# Patient Record
Sex: Male | Born: 1995 | Race: White | Hispanic: No | Marital: Single | Smoking: Former smoker
Health system: Southern US, Community
[De-identification: ages and names within clinical notes are randomized; demographics above are authoritative.]

## PROBLEM LIST (undated history)

## (undated) DIAGNOSIS — F909 Attention-deficit hyperactivity disorder, unspecified type: Secondary | ICD-10-CM

## (undated) DIAGNOSIS — S62109A Fracture of unspecified carpal bone, unspecified wrist, initial encounter for closed fracture: Secondary | ICD-10-CM

---

## 2017-03-03 ENCOUNTER — Emergency Department
Admission: EM | Admit: 2017-03-03 | Discharge: 2017-03-03 | Disposition: A | Discharge status: Home / Self Care | Payer: No Typology Code available for payment source | Attending: Emergency Medicine | Admitting: Emergency Medicine

## 2017-03-03 ENCOUNTER — Encounter: Payer: Self-pay | Admitting: Emergency Medicine

## 2017-03-03 ENCOUNTER — Other Ambulatory Visit: Payer: Self-pay

## 2017-03-03 DIAGNOSIS — M545 Low back pain: Secondary | ICD-10-CM | POA: Insufficient documentation

## 2017-03-03 DIAGNOSIS — M542 Cervicalgia: Secondary | ICD-10-CM | POA: Diagnosis not present

## 2017-03-03 DIAGNOSIS — Z041 Encounter for examination and observation following transport accident: Secondary | ICD-10-CM | POA: Insufficient documentation

## 2017-03-03 DIAGNOSIS — M7918 Myalgia, other site: Secondary | ICD-10-CM | POA: Diagnosis not present

## 2017-03-03 DIAGNOSIS — F17200 Nicotine dependence, unspecified, uncomplicated: Secondary | ICD-10-CM | POA: Diagnosis not present

## 2017-03-03 MED ORDER — IBUPROFEN 600 MG PO TABS: 600.0000 mg | ORAL_TABLET | Freq: Three times a day (TID) | ORAL | 0 refills | Status: DC | PRN

## 2017-03-03 MED ORDER — TRAMADOL HCL 50 MG PO TABS: 50.0000 mg | ORAL_TABLET | Freq: Four times a day (QID) | ORAL | 0 refills | Status: AC | PRN

## 2017-03-03 MED ORDER — CYCLOBENZAPRINE HCL 10 MG PO TABS: 10.0000 mg | ORAL_TABLET | Freq: Three times a day (TID) | ORAL | 0 refills | Status: DC | PRN
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
## 2018-07-30 ENCOUNTER — Ambulatory Visit
Admission: EM | Admit: 2018-07-30 | Discharge: 2018-07-30 | Disposition: A | Discharge status: Home / Self Care | Payer: 59 | Attending: Urgent Care | Admitting: Urgent Care

## 2018-07-30 ENCOUNTER — Other Ambulatory Visit: Payer: Self-pay

## 2018-07-30 DIAGNOSIS — L237 Allergic contact dermatitis due to plants, except food: Secondary | ICD-10-CM | POA: Diagnosis not present

## 2018-07-30 HISTORY — DX: Attention-deficit hyperactivity disorder, unspecified type: F90.9

## 2018-07-30 MED ORDER — PREDNISONE 10 MG (21) PO TBPK: ORAL_TABLET | Freq: Every day | ORAL | 0 refills | Status: DC

## 2018-07-30 MED ORDER — DIPHENHYDRAMINE HCL 2 % EX CREA: TOPICAL_CREAM | Freq: Three times a day (TID) | CUTANEOUS | 0 refills | Status: DC | PRN

## 2018-07-30 NOTE — Discharge Instructions (Addendum)
It was very nice seeing you today in clinic. Thank you for entrusting me with your care.   Please utilize the medications that we discussed. Your prescriptions have been called in to your pharmacy. Wash clothing and linens in [place] water to prevent spread. AVOID scratching.   Make arrangements to follow up with your regular doctor in 1 week for re-evaluation. If your symptoms/condition worsens, please seek follow up care either here or in the ER. Please remember, our [facility] providers are "right here with you" when you need [place].   Again, it was my pleasure to take care of you today. Thank you for choosing our clinic. I hope that you start to feel better quickly.   Honor Loh, MSN, APRN, FNP-C, CEN Advanced Practice Provider [facility]

## 2018-07-30 NOTE — ED Triage Notes (Signed)
Pt here for rash on both arms that has been present for almost 2 weeks. Has been trying otc stuff for poison ivy and oak without relief. It's red and itching. Gf also has the same thing

## 2018-07-30 NOTE — ED Provider Notes (Signed)
[address], [place], [place] [address] [phone]   Name: Braedan Meuth DOB: [birth year] MRN: [id number] CSN: [id number] PCP: Patient, No Pcp Per  Arrival date and time:  07/30/18 1247  Chief Complaint:  Rash  NOTE: Prior to seeing the patient today, I have reviewed the triage nursing documentation and vital signs. Clinical staff has updated patient's PMH/PSHx, current medication list, and drug allergies/intolerances to ensure comprehensive history available to assist in medical decision making.   History:   HPI: Jakeb Lamping is a 23 y.o. male who presents today with complaints of a pruritic rash to his BILATERAL upper extremities. Rash to initially declared 10 to 14 days ago. Rash started on his LEFT forearm and has progressively spread contralaterally to the RIGHT biceps area and axilla. Patient advising that he was working outside and inadvertently came into contact with poison oak while cutting down trees. Rash is erythematous and pruritic. He has not appreciated any drainage associated with the rash. There is no facial involvement; no rash to periorbital, paranasal, or perioral areas. Patient denies that he is experiencing any shortness of breath or sensation of pharyngeal/laryngeal fullness.   In efforts to help reduce/relieve the associated pruritis, the patient advising that he has used an unknown over-the-counter topical at home. Patient notes that conservative symptomatic management at home has proven to be ineffective overall.   Past Medical History:  Diagnosis Date  . ADHD     History reviewed. No pertinent surgical history.  Family History  Problem Relation Age of Onset  . Healthy Mother   . Diabetes Father     Social History   Socioeconomic History  . Marital status: Single    Spouse name: Not on file  . Number of children: Not on file  . Years of education: Not on file  . Highest education level: Not on file  Occupational History  . Not on  file  Social Needs  . Financial resource strain: Not on file  . Food insecurity    Worry: Not on file    Inability: Not on file  . Transportation needs    Medical: Not on file    Non-medical: Not on file  Tobacco Use  . Smoking status: Former Research scientist (life sciences)  . Smokeless tobacco: Never Used  Substance and Sexual Activity  . Alcohol use: No    Frequency: Never  . Drug use: Yes    Types: Marijuana  . Sexual activity: Not on file  Lifestyle  . Physical activity    Days per week: Not on file    Minutes per session: Not on file  . Stress: Not on file  Relationships  . Social Herbalist on phone: Not on file    Gets together: Not on file    Attends religious service: Not on file    Active member of club or organization: Not on file    Attends meetings of clubs or organizations: Not on file    Relationship status: Not on file  . Intimate partner violence    Fear of current or ex partner: Not on file    Emotionally abused: Not on file    Physically abused: Not on file    Forced sexual activity: Not on file  Other Topics Concern  . Not on file  Social History Narrative  . Not on file    There are no active problems to display for this patient.   Home Medications:    No outpatient medications have been  marked as taking for the 07/30/18 encounter [facility](Hospital Encounter).    Allergies:   Bee venom and Neosporin [neomycin-bacitracin zn-polymyx]  Review of Systems (ROS): Review of Systems  Constitutional: Negative for chills and fever.  HENT: Negative for drooling and trouble swallowing.   Respiratory: Negative for cough and shortness of breath.   Cardiovascular: Negative for chest pain and palpitations.  Skin: Positive for color change and rash.     Physical Exam:  Triage Vital Signs ED Triage Vitals  Enc Vitals Group     BP 07/30/18 1331 111/82     Pulse Rate 07/30/18 1331 (!) 59     Resp 07/30/18 1331 18     Temp 07/30/18 1331 97.9 F (36.6 C)     Temp Source  07/30/18 1331 Oral     SpO2 07/30/18 1331 100 %     Weight 07/30/18 1333 164 lb (74.4 kg)     Height --      Head Circumference --      Peak Flow --      Pain Score 07/30/18 1333 0     Pain Loc --      Pain Edu? --      Excl. in GC? --     Physical Exam  Constitutional: He is oriented to person, place, and time and well-developed, well-nourished, and in no distress.  HENT:  Head: Normocephalic and atraumatic.  Mouth/Throat: Oropharynx is clear and moist and mucous membranes are normal.  Eyes: Pupils are equal, round, and reactive to light. EOM are normal.  Neck: Normal range of motion. Neck supple. No tracheal deviation present.  Cardiovascular: Normal rate, regular rhythm, normal heart sounds and intact distal pulses. Exam reveals no gallop and no friction rub.  No murmur heard. Pulmonary/Chest: Effort normal and breath sounds normal. No respiratory distress. He has no wheezes. He has no rales.  Neurological: He is alert and oriented to person, place, and time.  Skin: Skin is warm and dry. Rash noted. Rash is vesicular (RIGHT biceps area and axilla; LEFT foream).  Rash pruritic.  No facial involvement.  No shortness of breath.  No oral pharyngeal/laryngeal fullness.  No dysphagia.   Psychiatric: Mood, affect and judgment normal.  Nursing note and vitals reviewed.    Urgent Care Treatments / Results:   LABS: PLEASE NOTE: all labs that were ordered this encounter are listed, however only abnormal results are displayed. Labs Reviewed - No data to display  EKG: -None  RADIOLOGY: No results found.  PROCEDURES: Procedures  MEDICATIONS RECEIVED THIS VISIT: Medications - No data to display  PERTINENT CLINICAL COURSE NOTES/UPDATES:   Initial Impression / Assessment and Plan / Urgent Care Course:    Janeal HolmesBrian Philippi is a 23 y.o. male who [facility] today with complaints of Rash  Pertinent labs & imaging results that were available during my care of the  patient were personally reviewed by me and considered in my medical decision making (see lab/imaging section of note for values and interpretations).  Patient overall well appearing and in no acute distress today in clinic. Exam reveals vesicular rash to patient's BILATERAL upper extremities.  Rash has worsened over the course of the last 10 to 14 days following known contact with poison oak.  Patient has used over-the-counter cream, however intervention has been ineffective in improving his symptoms.  Will treat patient with course of systemic steroids (prednisone).  He was encouraged to utilize oral diphenhydramine as needed for associated pruritus; educated on  the fact that it will make him sleepy.  Prescription sent for topical diphenhydramine for patient to utilize during the day to help with his itching.  He was encouraged to wash her close and linens in hot water to prevent further transmission.  Patient encouraged to avoid scratching to prevent spread.  Current clinical condition warrants patient being out of work in order to recover from his current injury/illness. He was provided with the appropriate documentation to provide to his place of employment that will allow for him to RTW on 08/01/2018 with no restrictions.   Discussed follow up with primary care physician in 1 week for re-evaluation. I have reviewed the follow up and strict return precautions for any new or worsening symptoms. Patient is aware of symptoms that would be deemed urgent/emergent, and would thus require further evaluation either here or in the emergency department. At the time of discharge, he verbalized understanding and consent with the discharge plan as it was reviewed with him. All questions were fielded by provider and/or clinic staff prior to patient discharge.    Final Clinical Impressions(s) / Urgent Care Diagnoses:   Final diagnoses:  Contact dermatitis due to poison oak    New Prescriptions:   Meds ordered  this encounter  Medications  . predniSONE (STERAPRED UNI-PAK 21 TAB) 10 MG (21) TBPK tablet    Sig: Take by mouth daily. Take by mouth: 60 mg x 1 day, 50 mg x 1 day, 40 mg x 1 day, 30 mg x 1 day, 20 mg x 1 day, 10 mg x 1 day    Dispense:  21 tablet    Refill:  0  . diphenhydrAMINE (BENADRYL) 2 % cream    Sig: Apply topically 3 (three) times daily as needed for itching.    Dispense:  30 g    Refill:  0    Controlled Substance Prescriptions:  [place] Controlled Substance Registry consulted? Not Applicable  NOTE: This note was prepared using Dragon dictation software along with smaller phrase technology. Despite my best ability to proofread, there is the potential that transcriptional errors may still occur from this process, and are completely unintentional.     Verlee MonteGray, Yves Fodor E, NP 07/30/18 1819

## 2019-08-10 ENCOUNTER — Ambulatory Visit
Admission: EM | Admit: 2019-08-10 | Discharge: 2019-08-10 | Disposition: A | Discharge status: Home / Self Care | Payer: HRSA Program | Attending: Family Medicine | Admitting: Family Medicine

## 2019-08-10 ENCOUNTER — Other Ambulatory Visit: Payer: Self-pay

## 2019-08-10 DIAGNOSIS — Z87891 Personal history of nicotine dependence: Secondary | ICD-10-CM | POA: Insufficient documentation

## 2019-08-10 DIAGNOSIS — R059 Cough, unspecified: Secondary | ICD-10-CM

## 2019-08-10 DIAGNOSIS — U071 COVID-19: Secondary | ICD-10-CM | POA: Insufficient documentation

## 2019-08-10 DIAGNOSIS — Z7952 Long term (current) use of systemic steroids: Secondary | ICD-10-CM | POA: Diagnosis not present

## 2019-08-10 DIAGNOSIS — Z79899 Other long term (current) drug therapy: Secondary | ICD-10-CM | POA: Diagnosis not present

## 2019-08-10 DIAGNOSIS — R05 Cough: Secondary | ICD-10-CM | POA: Insufficient documentation

## 2019-08-10 LAB — GROUP A STREP BY PCR: Group A Strep by PCR: NOT DETECTED

## 2019-08-10 MED ORDER — PSEUDOEPH-BROMPHEN-DM 30-2-10 MG/5ML PO SYRP: 10.0000 mL | ORAL_SOLUTION | Freq: Four times a day (QID) | ORAL | 0 refills | Status: DC | PRN

## 2019-08-10 NOTE — ED Provider Notes (Signed)
[facility]   [id number] 08/10/19 Arrival Time: 1541   CC: COVID symptoms  SUBJECTIVE: History from: patient.  Nathaniel Cooley is a 24 y.o. male who presents with abrupt onset of dry cough for the last 5 days.  Reports that he has taken Robitussin and Delsym with no relief.  Denies sick contacts.  Reports that he is working 2 jobs and thinks he has run himself down.  Requesting a note for work today.  Reports that he has not had Covid.  Reports he has not had Covid vaccines.  There are no aggravating or relieving factors.   Denies fever, chills, fatigue, sinus pain, rhinorrhea, sore throat, SOB, wheezing, chest pain, nausea, changes in bowel or bladder habits.    ROS: As per HPI.  All other pertinent ROS negative.     Past Medical History:  Diagnosis Date  . ADHD    History reviewed. No pertinent surgical history. Allergies  Allergen Reactions  . Bee Venom Swelling  . Neosporin [Neomycin-Bacitracin Zn-Polymyx] Rash   No current facility-administered medications on file prior to encounter.   Current Outpatient Medications on File Prior to Encounter  Medication Sig Dispense Refill  . cyclobenzaprine (FLEXERIL) 10 MG tablet Take 1 tablet (10 mg total) by mouth 3 (three) times daily as needed. 15 tablet 0  . diphenhydrAMINE (BENADRYL) 2 % cream Apply topically 3 (three) times daily as needed for itching. 30 g 0  . ibuprofen (ADVIL,MOTRIN) 600 MG tablet Take 1 tablet (600 mg total) by mouth every 8 (eight) hours as needed. 15 tablet 0  . predniSONE (STERAPRED UNI-PAK 21 TAB) 10 MG (21) TBPK tablet Take by mouth daily. Take by mouth: 60 mg x 1 day, 50 mg x 1 day, 40 mg x 1 day, 30 mg x 1 day, 20 mg x 1 day, 10 mg x 1 day 21 tablet 0   Social History   Socioeconomic History  . Marital status: Single    Spouse name: Not on file  . Number of children: Not on file  . Years of education: Not on file  . Highest education level: Not on file  Occupational History  . Not on file   Tobacco Use  . Smoking status: Former Games developer  . Smokeless tobacco: Never Used  Vaping Use  . Vaping Use: Former  Substance and Sexual Activity  . Alcohol use: No  . Drug use: Not Currently    Types: Marijuana  . Sexual activity: Not on file  Other Topics Concern  . Not on file  Social History Narrative  . Not on file   Social Determinants of Health   Financial Resource Strain:   . Difficulty of Paying Living Expenses:   Food Insecurity:   . Worried About Programme researcher, broadcasting/film/video in the Last Year:   . Barista in the Last Year:   Transportation Needs:   . Freight forwarder (Medical):   Marland Kitchen Lack of Transportation (Non-Medical):   Physical Activity:   . Days of Exercise per Week:   . Minutes of Exercise per Session:   Stress:   . Feeling of Stress :   Social Connections:   . Frequency of Communication with Friends and Family:   . Frequency of Social Gatherings with Friends and Family:   . Attends Religious Services:   . Active Member of Clubs or Organizations:   . Attends Banker Meetings:   Marland Kitchen Marital Status:   Intimate Partner Violence:   .  Fear of Current or Ex-Partner:   . Emotionally Abused:   Marland Kitchen Physically Abused:   . Sexually Abused:    Family History  Problem Relation Age of Onset  . Healthy Mother   . Diabetes Father     OBJECTIVE:  Vitals:   08/10/19 1555 08/10/19 1558  BP:  130/87  Pulse:  66  Resp:  16  Temp:  98.2 F (36.8 C)  TempSrc:  Oral  SpO2:  99%  Weight: 166 lb (75.3 kg)   Height: 5\' 11"  (1.803 m)      General appearance: alert; appears fatigued, but nontoxic; speaking in full sentences and tolerating own secretions HEENT: NCAT; Ears: EACs clear, TMs pearly gray; Eyes: PERRL.  EOM grossly intact. Sinuses: nontender; Nose: nares patent without rhinorrhea, Throat: oropharynx clear, tonsils non erythematous or enlarged, uvula midline  Neck: supple without LAD Lungs: unlabored respirations, symmetrical air entry;  cough: absent; no respiratory distress; CTAB Heart: regular rate and rhythm.  Radial pulses 2+ symmetrical bilaterally Skin: warm and dry Psychological: alert and cooperative; normal mood and affect  LABS:  No results found for this or any previous visit (from the past 24 hour(s)).   ASSESSMENT & PLAN:  1. Cough     Meds ordered this encounter  Medications  . brompheniramine-pseudoephedrine-DM 30-2-10 MG/5ML syrup    Sig: Take 10 mLs by mouth 4 (four) times daily as needed.    Dispense:  118 mL    Refill:  0    Order Specific Question:   Supervising Provider    Answer:   02-04-1975 Merrilee Jansky   Prescribe Bromfed for symptomatic treatment Rapid strep negative COVID testing ordered.  It will take between 1-2 days for test results.  Someone will contact you regarding abnormal results.    Patient should remain in quarantine until they have received Covid results.  If negative you may resume normal activities (go back to work/school) while practicing hand hygiene, social distance, and mask wearing.  If positive, patient should remain in quarantine for 10 days from symptom onset AND greater than 72 hours after symptoms resolution (absence of fever without the use of fever-reducing medication and improvement in respiratory symptoms), whichever is longer Get plenty of rest and push fluids Use OTC zyrtec for nasal congestion, runny nose, and/or sore throat Use OTC flonase for nasal congestion and runny nose Use medications daily for symptom relief Use OTC medications like ibuprofen or tylenol as needed fever or pain Call or go to the ED if you have any new or worsening symptoms such as fever, worsening cough, shortness of breath, chest tightness, chest pain, turning blue, changes in mental status.  Reviewed expectations re: course of current medical issues. Questions answered. Outlined signs and symptoms indicating need for more acute intervention. Patient verbalized  understanding. After Visit Summary given.         [[id number]], NP 08/10/19 1637

## 2019-08-10 NOTE — ED Triage Notes (Signed)
Pt states he's been coughing since Monday and now his throat started hurting.

## 2019-08-10 NOTE — Discharge Instructions (Addendum)
Your COVID test is pending.  You should self quarantine until the test result is back.    Rapid strep test is negative.  Will culture your specimen, and will let you know if it is positive needs further treatment.  Take Tylenol as needed for fever or discomfort.  Rest and keep yourself hydrated.    Go to the emergency department if you develop shortness of breath, severe diarrhea, high fever not relieved by Tylenol or ibuprofen, or other concerning symptoms.

## 2019-08-11 LAB — SARS CORONAVIRUS 2 (TAT 6-24 HRS): SARS Coronavirus 2: POSITIVE — AB

## 2019-08-13 ENCOUNTER — Telehealth: Payer: Self-pay | Admitting: Physician Assistant

## 2019-08-13 NOTE — Telephone Encounter (Signed)
Called to discuss with Nathaniel Cooley about Covid symptoms and the use of bamlanivimab/etesevimab or casirivimab/imdevimab, a monoclonal antibody infusion for those with mild to moderate Covid symptoms and at a high risk of hospitalization.     Pt is not qualified for this infusion due to lack of identified risk factors and co-morbid conditions.  Symptoms reviewed as well as criteria for ending isolation.  Symptoms reviewed that would warrant ED/Hospital evaluation as well should her condition worsen. Preventative practices reviewed. Patient verbalized understanding.  Pt does not qualify for infusion therapy as pt's symptoms first presented > 10 days prior to timing of infusion ( we open 7/8) Symptoms tier reviewed as well as criteria for ending isolation. Preventative practices reviewed. Patient verbalized understanding   [name] PA-C

## 2019-12-24 ENCOUNTER — Ambulatory Visit
Admission: EM | Admit: 2019-12-24 | Discharge: 2019-12-24 | Disposition: A | Discharge status: Home / Self Care | Payer: PRIVATE HEALTH INSURANCE | Attending: Emergency Medicine | Admitting: Emergency Medicine

## 2019-12-24 ENCOUNTER — Other Ambulatory Visit: Payer: Self-pay

## 2019-12-24 DIAGNOSIS — R059 Cough, unspecified: Secondary | ICD-10-CM | POA: Insufficient documentation

## 2019-12-24 DIAGNOSIS — Z20822 Contact with and (suspected) exposure to covid-19: Secondary | ICD-10-CM | POA: Insufficient documentation

## 2019-12-24 DIAGNOSIS — Z87891 Personal history of nicotine dependence: Secondary | ICD-10-CM | POA: Insufficient documentation

## 2019-12-24 MED ORDER — BENZONATATE 100 MG PO CAPS: 200.0000 mg | ORAL_CAPSULE | Freq: Three times a day (TID) | ORAL | 0 refills | Status: DC

## 2019-12-24 MED ORDER — PROMETHAZINE-DM 6.25-15 MG/5ML PO SYRP: 5.0000 mL | ORAL_SOLUTION | Freq: Four times a day (QID) | ORAL | 0 refills | Status: DC | PRN

## 2019-12-24 NOTE — ED Triage Notes (Signed)
Pt presents to Urgent Care with c/o dry cough x 1 week. Pt w/ no known COVID exposure; he has not been vaccinated.

## 2019-12-24 NOTE — ED Provider Notes (Signed)
[facility]    CSN: [id number] Arrival date & time: 12/24/19  1509      History   Chief Complaint Chief Complaint  Patient presents with   Cough    HPI Nathaniel Cooley is a 24 y.o. male.   24 year old male here for evaluation of dry cough.  Patient reports that he has had symptoms for the past week.  He denies shortness of breath or wheezing.  Denies fever, ear pain or pressure, nausea, vomiting, diarrhea, changes to taste or smell, sick contacts.  Patient has had some discomfort in his chest from coughing.  Patient has not received his Covid vaccine or his flu vaccine.     Past Medical History:  Diagnosis Date   ADHD     There are no problems to display for this patient.   History reviewed. No pertinent surgical history.     Home Medications    Prior to Admission medications   Medication Sig Start Date End Date Taking? Authorizing Provider  DM-Doxylamine-Acetaminophen (NYQUIL COLD & FLU PO) Take 5 mLs by mouth 4 (four) times daily as needed.   Yes [provider]  benzonatate (TESSALON) 100 MG capsule Take 2 capsules (200 mg total) by mouth every 8 (eight) hours. 12/24/19   Becky Augusta, NP  promethazine-dextromethorphan (PROMETHAZINE-DM) 6.25-15 MG/5ML syrup Take 5 mLs by mouth 4 (four) times daily as needed. 12/24/19   Becky Augusta, NP    Family History Family History  Problem Relation Age of Onset   Healthy Mother    Diabetes Father     Social History Social History   Tobacco Use   Smoking status: Former Smoker   Smokeless tobacco: Never Used  Building services engineer Use: Former  Substance Use Topics   Alcohol use: No   Drug use: Not Currently    Types: Marijuana     Allergies   Bee venom and Neosporin [neomycin-bacitracin zn-polymyx]   Review of Systems Review of Systems  Constitutional: Negative for activity change, appetite change, fatigue and fever.  HENT: Negative for congestion, ear discharge,  rhinorrhea, sinus pressure, sinus pain and sore throat.   Respiratory: Positive for cough. Negative for shortness of breath and wheezing.   Cardiovascular: Negative for chest pain.  Gastrointestinal: Negative for diarrhea, nausea and vomiting.  Musculoskeletal: Negative for arthralgias and myalgias.  Skin: Negative for rash.  Neurological: Negative for dizziness and numbness.  Hematological: Negative.   Psychiatric/Behavioral: Negative.      Physical Exam Triage Vital Signs ED Triage Vitals  Enc Vitals Group     BP 12/24/19 1638 136/75     Pulse Rate 12/24/19 1638 73     Resp 12/24/19 1638 16     Temp 12/24/19 1638 97.9 F (36.6 C)     Temp Source 12/24/19 1638 Oral     SpO2 12/24/19 1638 100 %     Weight 12/24/19 1633 161 lb (73 kg)     Height 12/24/19 1633 5\' 11"  (1.803 m)     Head Circumference --      Peak Flow --      Pain Score 12/24/19 1632 0     Pain Loc --      Pain Edu? --      Excl. in GC? --    No data found.  Updated Vital Signs BP 136/75 (BP Location: Right Arm)    Pulse 73    Temp 97.9 F (36.6 C) (Oral)    Resp 16  Ht 5\' 11"  (1.803 m)    Wt 161 lb (73 kg)    SpO2 100%    BMI 22.45 kg/m   Visual Acuity Right Eye Distance:   Left Eye Distance:   Bilateral Distance:    Right Eye Near:   Left Eye Near:    Bilateral Near:     Physical Exam Vitals and nursing note reviewed.  Constitutional:      General: He is not in acute distress.    Appearance: Normal appearance. He is normal weight. He is not toxic-appearing.  HENT:     Head: Normocephalic and atraumatic.     Right Ear: Tympanic membrane, ear canal and external ear normal.     Left Ear: Tympanic membrane, ear canal and external ear normal.     Nose: Congestion and rhinorrhea present.     Comments: Nasal mucosa is mildly edematous with clear postnasal drip.  No with erythema noted.  Patient has no tenderness to his sinuses with percussion.    Mouth/Throat:     Mouth: Mucous membranes are  moist.     Pharynx: Oropharynx is clear. No oropharyngeal exudate or posterior oropharyngeal erythema.     Comments: Patient has mild postnasal drip without erythema of posterior oropharynx. Eyes:     General: No scleral icterus.    Extraocular Movements: Extraocular movements intact.     Conjunctiva/sclera: Conjunctivae normal.     Pupils: Pupils are equal, round, and reactive to light.  Cardiovascular:     Rate and Rhythm: Normal rate and regular rhythm.     Pulses: Normal pulses.     Heart sounds: Normal heart sounds. No murmur heard.  No gallop.   Pulmonary:     Effort: Pulmonary effort is normal.     Breath sounds: Normal breath sounds. No wheezing, rhonchi or rales.  Musculoskeletal:        General: No swelling or tenderness. Normal range of motion.     Cervical back: Normal range of motion and neck supple.  Lymphadenopathy:     Cervical: No cervical adenopathy.  Skin:    General: Skin is warm and dry.     Capillary Refill: Capillary refill takes less than 2 seconds.     Findings: No erythema or rash.  Neurological:     General: No focal deficit present.     Mental Status: He is alert and oriented to person, place, and time.  Psychiatric:        Mood and Affect: Mood normal.        Behavior: Behavior normal.        Thought Content: Thought content normal.        Judgment: Judgment normal.      UC Treatments / Results  Labs (all labs ordered are listed, but only abnormal results are displayed) Labs Reviewed  SARS CORONAVIRUS 2 (TAT 6-24 HRS)    EKG   Radiology No results found.  Procedures Procedures (including critical care time)  Medications Ordered in UC Medications - No data to display  Initial Impression / Assessment and Plan / UC Course  I have reviewed the triage vital signs and the nursing notes.  Pertinent labs & imaging results that were available during my care of the patient were reviewed by me and considered in my medical decision making  (see chart for details).   Disease had a dry cough for a week.  Patient has no other symptoms.  Physical exam reveals mildly edematous nasal mucosa with clear nasal  discharge and clear postnasal drip.  No cervical lymphadenopathy appreciated no sinus tenderness to percussion.  Lung sounds are clear to auscultation all fields.  Patient states he does have allergies but typically only in the spring.  Will check Covid.  Will DC patient home with supportive therapy to await his Covid test results.  If the Covid test is positive will refer him to monoclonal antibody infusion.   Final Clinical Impressions(s) / UC Diagnoses   Final diagnoses:  Cough     Discharge Instructions     Isolate at home until the results of your Covid test are back.  If your Covid test is positive you will have to quarantine for 10 days from the start of your symptoms.  After 10 days you can break quarantine if your symptoms have improved and you have not had a fever for 24 hours.  Use the Tessalon Perles during the day for cough and the Promethazine DM at bedtime.  Do not take it with NyQuil.  Take over-the-counter Claritin, Zyrtec, or Allegra to help with postnasal drip that could be driving her cough.  Follow-up with your primary care provider if your symptoms worsen or continue.    ED Prescriptions    Medication Sig Dispense Auth. Provider   benzonatate (TESSALON) 100 MG capsule Take 2 capsules (200 mg total) by mouth every 8 (eight) hours. 21 capsule Becky Augusta, NP   promethazine-dextromethorphan (PROMETHAZINE-DM) 6.25-15 MG/5ML syrup Take 5 mLs by mouth 4 (four) times daily as needed. 118 mL Becky Augusta, NP     PDMP not reviewed this encounter.   Becky Augusta, NP 12/24/19 1708

## 2019-12-24 NOTE — Discharge Instructions (Addendum)
Isolate at home until the results of your Covid test are back.  If your Covid test is positive you will have to quarantine for 10 days from the start of your symptoms.  After 10 days you can break quarantine if your symptoms have improved and you have not had a fever for 24 hours.  Use the Tessalon Perles during the day for cough and the Promethazine DM at bedtime.  Do not take it with NyQuil.  Take over-the-counter Claritin, Zyrtec, or Allegra to help with postnasal drip that could be driving her cough.  Follow-up with your primary care provider if your symptoms worsen or continue.

## 2019-12-25 LAB — SARS CORONAVIRUS 2 (TAT 6-24 HRS): SARS Coronavirus 2: NEGATIVE

## 2019-12-30 ENCOUNTER — Ambulatory Visit (INDEPENDENT_AMBULATORY_CARE_PROVIDER_SITE_OTHER): Payer: PRIVATE HEALTH INSURANCE

## 2019-12-30 ENCOUNTER — Other Ambulatory Visit: Payer: Self-pay

## 2019-12-30 ENCOUNTER — Ambulatory Visit
Admission: EM | Admit: 2019-12-30 | Discharge: 2019-12-30 | Disposition: A | Discharge status: Home / Self Care | Payer: PRIVATE HEALTH INSURANCE | Attending: Emergency Medicine | Admitting: Emergency Medicine

## 2019-12-30 DIAGNOSIS — R6883 Chills (without fever): Secondary | ICD-10-CM

## 2019-12-30 DIAGNOSIS — R059 Cough, unspecified: Secondary | ICD-10-CM | POA: Diagnosis not present

## 2019-12-30 DIAGNOSIS — Z87891 Personal history of nicotine dependence: Secondary | ICD-10-CM | POA: Diagnosis not present

## 2019-12-30 MED ORDER — PREDNISONE 10 MG (21) PO TBPK: ORAL_TABLET | Freq: Every day | ORAL | 0 refills | Status: DC

## 2019-12-30 MED ORDER — DOXYCYCLINE HYCLATE 100 MG PO CAPS: 100.0000 mg | ORAL_CAPSULE | Freq: Two times a day (BID) | ORAL | 0 refills | Status: DC

## 2019-12-30 NOTE — ED Triage Notes (Signed)
Patient complains of cough and chills x 2 weeks. States that he has been taking the cough medicine. States that the tessalon perles has given him a migraine. Reports that he has drank all of the promethazine syrup without relief. Believes that cough has worsened.

## 2019-12-30 NOTE — Discharge Instructions (Addendum)
Take the prednisone as directed.  Take the doxycycline twice daily with food for 10 days.  We will arrange for an outpatient CT scan for better clarification of what this density in her right chest is.  We have referred you to our our administrative team to help you find a primary care provider.

## 2019-12-30 NOTE — ED Provider Notes (Signed)
[facility]    CSN: [id number] Arrival date & time: 12/30/19  1129      History   Chief Complaint Chief Complaint  Patient presents with   Cough    HPI Nathaniel Cooley is a 24 y.o. male.   HPI   24 year old male here for reevaluation of cough.  Patient was evaluated here at the urgent care 2 days ago for a cough that he had for 1 week.  He denied shortness of breath or wheezing at that time patient still denies shortness of breath or wheezing, no fever, no sputum production.  Patient was prescribed Promethazine DM and Tessalon Perles.  Patient reports that the [facility] were giving him a migraine headache.  Patient reports that he coughs so much now that he gets to the point of posttussive emesis.  Patient has completely finished 118 mL of Promethazine DM syrup.  Patient reports that he could not take it every 6 hours because he was at work so he would take it 20 mL at a time.  Past Medical History:  Diagnosis Date   ADHD     There are no problems to display for this patient.   History reviewed. No pertinent surgical history.     Home Medications    Prior to Admission medications   Medication Sig Start Date End Date Taking? Authorizing Provider  doxycycline (VIBRAMYCIN) 100 MG capsule Take 1 capsule (100 mg total) by mouth 2 (two) times daily. 12/30/19   Becky Augusta, NP  predniSONE (STERAPRED UNI-PAK 21 TAB) 10 MG (21) TBPK tablet Take by mouth daily. Take 6 tabs by mouth daily  for 2 days, then 5 tabs for 2 days, then 4 tabs for 2 days, then 3 tabs for 2 days, 2 tabs for 2 days, then 1 tab by mouth daily for 2 days 12/30/19   Becky Augusta, NP    Family History Family History  Problem Relation Age of Onset   Healthy Mother    Diabetes Father     Social History Social History   Tobacco Use   Smoking status: Former Smoker   Smokeless tobacco: Never Used  Building services engineer Use: Former  Substance Use Topics   Alcohol use: No    Drug use: Not Currently    Types: Marijuana     Allergies   Bee venom and Neosporin [neomycin-bacitracin zn-polymyx]   Review of Systems Review of Systems  Constitutional: Negative for activity change, appetite change and fever.  HENT: Negative for congestion, rhinorrhea and sore throat.   Respiratory: Positive for cough. Negative for shortness of breath.   Cardiovascular: Negative for chest pain.  Gastrointestinal: Negative for diarrhea, nausea and vomiting.  Musculoskeletal: Negative for arthralgias and myalgias.  Skin: Negative for rash.  Neurological: Negative for headaches.  Hematological: Negative.   Psychiatric/Behavioral: Negative.      Physical Exam Triage Vital Signs ED Triage Vitals [12/30/19 1203]  Enc Vitals Group     BP 112/82     Pulse Rate 66     Resp 18     Temp 97.8 F (36.6 C)     Temp Source Oral     SpO2 100 %     Weight 160 lb 15 oz (73 kg)     Height 5\' 11"  (1.803 m)     Head Circumference      Peak Flow      Pain Score 6     Pain Loc  Pain Edu?      Excl. in GC?    No data found.  Updated Vital Signs BP 112/82 (BP Location: Left Arm)    Pulse 66    Temp 97.8 F (36.6 C) (Oral)    Resp 18    Ht 5\' 11"  (1.803 m)    Wt 160 lb 15 oz (73 kg)    SpO2 100%    BMI 22.45 kg/m   Visual Acuity Right Eye Distance:   Left Eye Distance:   Bilateral Distance:    Right Eye Near:   Left Eye Near:    Bilateral Near:     Physical Exam Vitals and nursing note reviewed.  Constitutional:      General: He is not in acute distress.    Appearance: Normal appearance. He is normal weight. He is not toxic-appearing.  HENT:     Head: Normocephalic and atraumatic.  Eyes:     General: No scleral icterus.    Extraocular Movements: Extraocular movements intact.     Conjunctiva/sclera: Conjunctivae normal.     Pupils: Pupils are equal, round, and reactive to light.  Cardiovascular:     Rate and Rhythm: Normal rate and regular rhythm.     Pulses:  Normal pulses.     Heart sounds: Normal heart sounds. No murmur heard.  No gallop.   Pulmonary:     Effort: Pulmonary effort is normal.     Breath sounds: Normal breath sounds. No wheezing, rhonchi or rales.  Musculoskeletal:        General: No swelling or tenderness. Normal range of motion.  Skin:    General: Skin is warm and dry.     Capillary Refill: Capillary refill takes less than 2 seconds.     Findings: No bruising, erythema or lesion.  Neurological:     General: No focal deficit present.     Mental Status: He is alert and oriented to person, place, and time.  Psychiatric:        Mood and Affect: Mood normal.        Behavior: Behavior normal.        Thought Content: Thought content normal.        Judgment: Judgment normal.      UC Treatments / Results  Labs (all labs ordered are listed, but only abnormal results are displayed) Labs Reviewed - No data to display  EKG   Radiology DG Chest 2 View  Result Date: 12/30/2019 CLINICAL DATA:  Pt reports worsening cough and chills x 2 weeks with no OTC cough medicine relief . Previous smoker. cough EXAM: CHEST - 2 VIEW COMPARISON:  None. FINDINGS: Is RIGHT suprahilar density measuring 2.5 cm. No pleural fluid. No peripheral airspace disease. No pneumothorax. No acute osseous abnormality. IMPRESSION: RIGHT suprahilar density. Differential would include focal airspace disease, unusual vascular pattern, or less likely pulmonary parenchymal nodule. Recommend clinical correlation and consider CT of the thorax. At minimum recommend follow-up chest radiograph. Electronically Signed   By: 01/01/2020 M.D.   On: 12/30/2019 13:16    Procedures Procedures (including critical care time)  Medications Ordered in UC Medications - No data to display  Initial Impression / Assessment and Plan / UC Course  I have reviewed the triage vital signs and the nursing notes.  Pertinent labs & imaging results that were available during my care  of the patient were reviewed by me and considered in my medical decision making (see chart for details).   Patient has return  for reevaluation of his cough.  The treatment at home has not been helping him.  Patient reports that he does not get any relief from NyQuil, Promethazine DM, or the Tessalon Perles.  Patient reports the [facility] were causing the have a headache.  Patient also reports that he could not take the cough syrup while at work so he would just take the full dose dose at 1 sitting which was 20 mL.  He has completed the entire 118 mL bottle that he was prescribed.  Lung sounds are clear to auscultation but given the setting of continued cough will obtain chest x-ray.  Patient had a negative flu and Covid test 2 days ago.  Radiology is reading a 2.5 cm right suprahilar density on chest x-ray.  Recommending a CT scan for better clarification.  Patient declining CT scan of chest today. Will put in for a future order to have it performed this coming Wednesday. Will give patient prednisone to try and calm down inflammation and help with the cough. Patient also does not have a primary care provider so will make referral. Final Clinical Impressions(s) / UC Diagnoses   Final diagnoses:  Cough     Discharge Instructions     Take the prednisone as directed.  Take the doxycycline twice daily with food for 10 days.  We will arrange for an outpatient CT scan for better clarification of what this density in her right chest is.  We have referred you to our our administrative team to help you find a primary care provider.    ED Prescriptions    Medication Sig Dispense Auth. Provider   predniSONE (STERAPRED UNI-PAK 21 TAB) 10 MG (21) TBPK tablet Take by mouth daily. Take 6 tabs by mouth daily  for 2 days, then 5 tabs for 2 days, then 4 tabs for 2 days, then 3 tabs for 2 days, 2 tabs for 2 days, then 1 tab by mouth daily for 2 days 42 tablet Becky Augusta, NP   doxycycline  (VIBRAMYCIN) 100 MG capsule Take 1 capsule (100 mg total) by mouth 2 (two) times daily. 20 capsule Becky Augusta, NP     I have reviewed the PDMP during this encounter.   Becky Augusta, NP 12/30/19 1337

## 2020-01-02 ENCOUNTER — Other Ambulatory Visit: Payer: Self-pay

## 2020-01-02 ENCOUNTER — Ambulatory Visit
Admission: RE | Admit: 2020-01-02 | Discharge: 2020-01-02 | Disposition: A | Discharge status: Home / Self Care | Payer: PRIVATE HEALTH INSURANCE | Source: Ambulatory Visit | Attending: Emergency Medicine | Admitting: Emergency Medicine

## 2020-01-02 DIAGNOSIS — J188 Other pneumonia, unspecified organism: Secondary | ICD-10-CM | POA: Diagnosis not present

## 2020-01-02 DIAGNOSIS — J984 Other disorders of lung: Secondary | ICD-10-CM | POA: Insufficient documentation

## 2020-01-02 DIAGNOSIS — R911 Solitary pulmonary nodule: Secondary | ICD-10-CM | POA: Insufficient documentation

## 2020-01-02 DIAGNOSIS — R059 Cough, unspecified: Secondary | ICD-10-CM | POA: Insufficient documentation

## 2020-01-02 MED ORDER — IOHEXOL 300 MG/ML  SOLN
75.0000 mL | Freq: Once | INTRAMUSCULAR | Status: AC | PRN
  Administered 2020-01-02: 75 mL via INTRAVENOUS

## 2020-01-03 ENCOUNTER — Emergency Department: Payer: PRIVATE HEALTH INSURANCE

## 2020-01-03 ENCOUNTER — Inpatient Hospital Stay
Admission: EM | Admit: 2020-01-03 | Discharge: 2020-01-07 | DRG: 195 | Disposition: A | Discharge status: Home / Self Care | Payer: PRIVATE HEALTH INSURANCE | Attending: Internal Medicine | Admitting: Internal Medicine

## 2020-01-03 ENCOUNTER — Other Ambulatory Visit: Payer: Self-pay

## 2020-01-03 ENCOUNTER — Encounter: Payer: Self-pay | Admitting: Internal Medicine

## 2020-01-03 DIAGNOSIS — Z888 Allergy status to other drugs, medicaments and biological substances status: Secondary | ICD-10-CM

## 2020-01-03 DIAGNOSIS — J984 Other disorders of lung: Secondary | ICD-10-CM | POA: Diagnosis present

## 2020-01-03 DIAGNOSIS — Z20822 Contact with and (suspected) exposure to covid-19: Secondary | ICD-10-CM | POA: Diagnosis present

## 2020-01-03 DIAGNOSIS — R001 Bradycardia, unspecified: Secondary | ICD-10-CM | POA: Diagnosis not present

## 2020-01-03 DIAGNOSIS — R059 Cough, unspecified: Secondary | ICD-10-CM

## 2020-01-03 DIAGNOSIS — Z87891 Personal history of nicotine dependence: Secondary | ICD-10-CM

## 2020-01-03 DIAGNOSIS — Z8616 Personal history of COVID-19: Secondary | ICD-10-CM | POA: Diagnosis not present

## 2020-01-03 DIAGNOSIS — Z91030 Bee allergy status: Secondary | ICD-10-CM

## 2020-01-03 DIAGNOSIS — Z79899 Other long term (current) drug therapy: Secondary | ICD-10-CM | POA: Diagnosis not present

## 2020-01-03 DIAGNOSIS — Z7952 Long term (current) use of systemic steroids: Secondary | ICD-10-CM | POA: Diagnosis not present

## 2020-01-03 DIAGNOSIS — R9389 Abnormal findings on diagnostic imaging of other specified body structures: Secondary | ICD-10-CM

## 2020-01-03 DIAGNOSIS — J189 Pneumonia, unspecified organism: Secondary | ICD-10-CM | POA: Diagnosis not present

## 2020-01-03 DIAGNOSIS — J188 Other pneumonia, unspecified organism: Principal | ICD-10-CM | POA: Diagnosis present

## 2020-01-03 LAB — TROPONIN I (HIGH SENSITIVITY)
Troponin I (High Sensitivity): 3 ng/L (ref <18)
Troponin I (High Sensitivity): 3 ng/L (ref <18)

## 2020-01-03 LAB — CBC
HCT: 41.9 % (ref 39.0–52.0)
Hemoglobin: 13.8 g/dL (ref 13.0–17.0)
MCH: 28.9 pg (ref 26.0–34.0)
MCHC: 32.9 g/dL (ref 30.0–36.0)
MCV: 87.8 fL (ref 80.0–100.0)
Platelets: 236 10*3/uL (ref 150–400)
RBC: 4.77 MIL/uL (ref 4.22–5.81)
RDW: 12 % (ref 11.5–15.5)
WBC: 5.1 10*3/uL (ref 4.0–10.5)
nRBC: 0 % (ref 0.0–0.2)

## 2020-01-03 LAB — COMPREHENSIVE METABOLIC PANEL
ALT: 15 U/L (ref 0–44)
AST: 17 U/L (ref 15–41)
Albumin: 3.9 g/dL (ref 3.5–5.0)
Alkaline Phosphatase: 65 U/L (ref 38–126)
Anion gap: 10 (ref 5–15)
BUN: 19 mg/dL (ref 6–20)
CO2: 23 mmol/L (ref 22–32)
Calcium: 8.9 mg/dL (ref 8.9–10.3)
Chloride: 107 mmol/L (ref 98–111)
Creatinine, Ser: 0.9 mg/dL (ref 0.61–1.24)
GFR, Estimated: >60 mL/min (ref >60)
Glucose, Bld: 84 mg/dL (ref 70–99)
Potassium: 3.6 mmol/L (ref 3.5–5.1)
Sodium: 140 mmol/L (ref 135–145)
Total Bilirubin: 0.7 mg/dL (ref 0.3–1.2)
Total Protein: 6.7 g/dL (ref 6.5–8.1)

## 2020-01-03 LAB — RESP PANEL BY RT-PCR (FLU A&B, COVID) ARPGX2
Influenza A by PCR: NEGATIVE
Influenza B by PCR: NEGATIVE
SARS Coronavirus 2 by RT PCR: NEGATIVE

## 2020-01-03 LAB — HIV ANTIBODY (ROUTINE TESTING W REFLEX): HIV Screen 4th Generation wRfx: NONREACTIVE

## 2020-01-03 MED ORDER — SUCCINYLCHOLINE CHLORIDE 20 MG/ML IJ SOLN: INTRAMUSCULAR | Status: AC | PRN

## 2020-01-03 MED ORDER — ACETAMINOPHEN 325 MG PO TABS: 650.0000 mg | ORAL_TABLET | Freq: Four times a day (QID) | ORAL | Status: DC | PRN

## 2020-01-03 MED ORDER — SODIUM CHLORIDE 0.9 % IV SOLN
2.0000 g | INTRAVENOUS | Status: DC
  Administered 2020-01-03 – 2020-01-06 (×4): 2 g via INTRAVENOUS
  Filled 2020-01-03 (×2): qty 2
  Filled 2020-01-03 (×3): qty 20

## 2020-01-03 MED ORDER — SODIUM CHLORIDE 0.9 % IV SOLN
500.0000 mg | INTRAVENOUS | Status: DC
  Administered 2020-01-03 – 2020-01-06 (×4): 500 mg via INTRAVENOUS
  Filled 2020-01-03 (×5): qty 500

## 2020-01-03 MED ORDER — TRAMADOL HCL 50 MG PO TABS
50.0000 mg | ORAL_TABLET | Freq: Two times a day (BID) | ORAL | Status: DC | PRN
  Administered 2020-01-04: 50 mg via ORAL
  Filled 2020-01-03: qty 1

## 2020-01-03 MED ORDER — FENTANYL CITRATE (PF) 100 MCG/2ML IJ SOLN
25.0000 ug | Freq: Once | INTRAMUSCULAR | Status: AC
  Administered 2020-01-03: 25 ug via INTRAVENOUS
  Filled 2020-01-03: qty 2

## 2020-01-03 MED ORDER — ETOMIDATE 2 MG/ML IV SOLN: INTRAVENOUS | Status: AC | PRN

## 2020-01-03 MED ORDER — KETOROLAC TROMETHAMINE 30 MG/ML IJ SOLN
15.0000 mg | Freq: Once | INTRAMUSCULAR | Status: AC
  Administered 2020-01-03: 15 mg via INTRAVENOUS
  Filled 2020-01-03: qty 1

## 2020-01-03 MED ORDER — SODIUM CHLORIDE 0.9 % IV SOLN: INTRAVENOUS | Status: DC

## 2020-01-03 MED ORDER — LACTATED RINGERS IV BOLUS
1000.0000 mL | Freq: Once | INTRAVENOUS | Status: AC
  Administered 2020-01-03: 1000 mL via INTRAVENOUS

## 2020-01-03 NOTE — ED Notes (Signed)
Zithromax restarted.

## 2020-01-03 NOTE — ED Notes (Signed)
Bed is cleaned on the floor. Transport request put in.

## 2020-01-03 NOTE — ED Notes (Signed)
Pt given meal tray.

## 2020-01-03 NOTE — ED Notes (Signed)
Patient is eating dinner tray. NAD.
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
## 2020-01-03 NOTE — ED Triage Notes (Signed)
Pt states that he has had a cough and not feeling well for the past couple weeks, pt states that he was seen at urgent care and negative for covid, had an x-ray and done and told he had a pneumonia and was started on prednisone, and something for his cough. Pt states this am he had a sudden onset of left side pain that caused him to fall down.

## 2020-01-03 NOTE — ED Notes (Signed)
Patient given pillow and warm blanket. Mother at bedside.

## 2020-01-03 NOTE — ED Notes (Signed)
Rapid response and documentation of succinylcholine documented in error.  Wrong patient.  Medications not given.

## 2020-01-03 NOTE — ED Triage Notes (Addendum)
Pt presents via EMS with COVID. Dx with COVID in July per EMS and treated at [facility] with Prednisone a couple of days ago with CT chest completed with pneumonia. Reporting LLQ pain.

## 2020-01-03 NOTE — ED Notes (Signed)
Patient c/o burning at left hand IV site. Medication paused until Rocephin is finished in left forearm site and then will resume Zithromax infusion in left forearm site. Good blood return noted from left hand IV site.

## 2020-01-03 NOTE — H&P (Signed)
History and Physical    Annette Liotta XIP:[id number] DOB: 22-Jan-1996 DOA: 01/03/2020  PCP: Patient, No Pcp Per   Patient coming from: Home  I have personally briefly reviewed patient's old medical records in [facility]  Chief Complaint: Cough                                Left lateral chest wall pain  HPI: Nathaniel Cooley is a 24 y.o. male with no significant past medical history who presents to the ER for evaluation of severe left lateral chest wall pain and a refractory nonproductive cough. Patient states that he had Covid in July and had recovered completely from that but about 2 weeks ago he developed a cough which is nonproductive, severe and has not responded to antitussives. He has had multiple urgent care visits in the past 10 days for evaluation of this cough and was recently placed on doxycycline and prednisone. He had a chest x-ray which showed a right upper lobe density and a follow-up CT scan which showed a cavitary lesion in his right upper lobe. He presented to the emergency room today with his mother due to severe left lateral chest wall pain. He denies having any fever, no chills, no shortness of breath, no weight loss, he denies having any night sweats or any contact with someone who has a chronic cough. He denies having any chest pain, no nausea, no vomiting, no abdominal pain or any changes in his bowel habits. Labs show sodium 140, potassium 3.6, chloride 107, bicarb 23, glucose 84, BUN 19, creatinine 0.90, calcium 8.9, albumin 3.9, AST 17, ALT 15, total protein 6.7, total bilirubin 0.7, white count 5.1, hemoglobin 13.8, hematocrit 41.9, MCV 87.8, RDW 12.0, platelet count 236 Respiratory viral panel is pending Chest x-ray shows vague right suprahilar opacity and subtle groundglass opacities within the periphery of the right upper lobe CT scan of the chest shows 16 x 13 mm cavitating abnormality is noted posteriorly in the right upper lobe most consistent with  cavitating pneumonia. At least 2 other densities are noted in the right upper lobe, with 1 measuring 12 x 7 mm just above the right minor fissure. These findings most likely represent multifocal inflammation or pneumonia. Follow-up CT scan in 3-4 weeks is recommended to ensure resolution and rule out underlying malignancy. Twelve-lead EKG reviewed by me shows normal sinus rhythm    ED Course: Patient is a 24 year old Caucasian male who presents to the ER accompanied by his mother for evaluation of left lateral chest wall pain as well as a refractory nonproductive cough. Patient has had multiple visits to the an urgent care center for same and was treated with doxycycline and prednisone. He had a CT scan of the chest which shows cavitating pneumonia in the right upper lobe. Discussed with pulmonologist, patient will be placed on isolation until TB is ruled out. For possible bronchoscopy in a.m. patient will be admitted to the hospital for further evaluation.  Review of Systems: As per HPI otherwise 10 point review of systems negative.    Past Medical History:  Diagnosis Date  . ADHD     No past surgical history on file.   reports that he has quit smoking. He has never used smokeless tobacco. He reports previous drug use. Drug: Marijuana. He reports that he does not drink alcohol.  Allergies  Allergen Reactions  . Bee Venom Swelling  . Neosporin [Neomycin-Bacitracin Zn-Polymyx]  Rash    Family History  Problem Relation Age of Onset  . Healthy Mother   . Diabetes Father      Prior to Admission medications   Medication Sig Start Date End Date Taking? Authorizing Provider  doxycycline (VIBRAMYCIN) 100 MG capsule Take 1 capsule (100 mg total) by mouth 2 (two) times daily. 12/30/19   Becky Augusta, NP  predniSONE (STERAPRED UNI-PAK 21 TAB) 10 MG (21) TBPK tablet Take by mouth daily. Take 6 tabs by mouth daily  for 2 days, then 5 tabs for 2 days, then 4 tabs for 2 days, then 3 tabs for 2  days, 2 tabs for 2 days, then 1 tab by mouth daily for 2 days 12/30/19   Becky Augusta, NP    Physical Exam: Vitals:   01/03/20 1234  BP: 129/80  Pulse: 75  Resp: 18  Temp: 98.5 F (36.9 C)  TempSrc: Oral  SpO2: 100%  Weight: 77.1 kg  Height: 5\' 11"  (1.803 m)     Vitals:   01/03/20 1234  BP: 129/80  Pulse: 75  Resp: 18  Temp: 98.5 F (36.9 C)  TempSrc: Oral  SpO2: 100%  Weight: 77.1 kg  Height: 5\' 11"  (1.803 m)    Constitutional: NAD, alert and oriented x 3, thin Eyes: PERRL, lids and conjunctivae normal ENMT: Mucous membranes are moist.  Neck: normal, supple, no masses, no thyromegaly Respiratory: Bilateral air entry, no wheezing, no crackles. Normal respiratory effort. No accessory muscle use.  Cardiovascular: Regular rate and rhythm, no murmurs / rubs / gallops. No extremity edema. 2+ pedal pulses. No carotid bruits.  Abdomen: no tenderness, no masses palpated. No hepatosplenomegaly. Bowel sounds positive.  Musculoskeletal: no clubbing / cyanosis. No joint deformity upper and lower extremities.  Skin: no rashes, lesions, ulcers.  Neurologic: No gross focal neurologic deficit. Psychiatric: Normal mood and affect.   Labs on Admission: I have personally reviewed following labs and imaging studies  CBC: Recent Labs  Lab 01/03/20 1238  WBC 5.1  HGB 13.8  HCT 41.9  MCV 87.8  PLT 236   Basic Metabolic Panel: Recent Labs  Lab 01/03/20 1238  NA 140  K 3.6  CL 107  CO2 23  GLUCOSE 84  BUN 19  CREATININE 0.90  CALCIUM 8.9   GFR: Estimated Creatinine Clearance: 132.5 mL/min (by C-G formula based on SCr of 0.9 mg/dL). Liver Function Tests: Recent Labs  Lab 01/03/20 1238  AST 17  ALT 15  ALKPHOS 65  BILITOT 0.7  PROT 6.7  ALBUMIN 3.9   No results for input(s): LIPASE, AMYLASE in the last 168 hours. No results for input(s): AMMONIA in the last 168 hours. Coagulation Profile: No results for input(s): INR, PROTIME in the last 168 hours. Cardiac  Enzymes: No results for input(s): CKTOTAL, CKMB, CKMBINDEX, TROPONINI in the last 168 hours. BNP (last 3 results) No results for input(s): PROBNP in the last 8760 hours. HbA1C: No results for input(s): HGBA1C in the last 72 hours. CBG: No results for input(s): GLUCAP in the last 168 hours. Lipid Profile: No results for input(s): CHOL, HDL, LDLCALC, TRIG, CHOLHDL, LDLDIRECT in the last 72 hours. Thyroid Function Tests: No results for input(s): TSH, T4TOTAL, FREET4, T3FREE, THYROIDAB in the last 72 hours. Anemia Panel: No results for input(s): VITAMINB12, FOLATE, FERRITIN, TIBC, IRON, RETICCTPCT in the last 72 hours. Urine analysis: No results found for: COLORURINE, APPEARANCEUR, LABSPEC, PHURINE, GLUCOSEU, HGBUR, BILIRUBINUR, KETONESUR, PROTEINUR, UROBILINOGEN, NITRITE, LEUKOCYTESUR  Radiological Exams on Admission: DG Chest 2  View  Result Date: 01/03/2020 CLINICAL DATA:  Cough, pneumonia EXAM: CHEST - 2 VIEW COMPARISON:  01/02/2020 FINDINGS: The heart size and mediastinal contours are within normal limits. Vague right suprahilar opacity and subtle ground-glass opacities within the periphery of the right upper lobe, better seen on previous CT. No new airspace consolidations. No pleural effusion or pneumothorax. The visualized skeletal structures are unremarkable. IMPRESSION: Vague right suprahilar opacity and subtle ground-glass opacities within the periphery of the right upper lobe, better seen on previous CT. No appreciable interval change. Electronically Signed   By: Duanne Guess D.O.   On: 01/03/2020 13:13   CT CHEST W CONTRAST  Result Date: 01/02/2020 CLINICAL DATA:  Cough, lung nodule. EXAM: CT CHEST WITH CONTRAST TECHNIQUE: Multidetector CT imaging of the chest was performed during intravenous contrast administration. CONTRAST:  62mL OMNIPAQUE IOHEXOL 300 MG/ML  SOLN COMPARISON:  December 30, 2019. FINDINGS: Cardiovascular: No significant vascular findings. Normal heart size. No  pericardial effusion. Mediastinum/Nodes: No enlarged mediastinal, hilar, or axillary lymph nodes. Thyroid gland, trachea, and esophagus demonstrate no significant findings. Lungs/Pleura: No pneumothorax or pleural effusion is noted. Left lung is clear. 16 x 13 mm cavitating abnormality is noted posteriorly in the right upper lobe most consistent with cavitating pneumonia. At least 2 other densities are noted in the right upper lobe, with 1 measuring 12 x 7 mm just above the right minor fissure. These findings most likely represent multifocal inflammation or pneumonia. Upper Abdomen: No acute abnormality. Musculoskeletal: No chest wall abnormality. No acute or significant osseous findings. IMPRESSION: 16 x 13 mm cavitating abnormality is noted posteriorly in the right upper lobe most consistent with cavitating pneumonia. At least 2 other densities are noted in the right upper lobe, with 1 measuring 12 x 7 mm just above the right minor fissure. These findings most likely represent multifocal inflammation or pneumonia. Follow-up CT scan in 3-4 weeks is recommended to ensure resolution and rule out underlying malignancy. Electronically Signed   By: Lupita Raider M.D.   On: 01/02/2020 12:44    EKG: Independently reviewed.  Sinus rhythm  Assessment/Plan Active Problems:   Cavitary pneumonia     Cavitary pneumonia Involving the right upper lobe ?? Will need to rule out pulmonary tuberculosis Will place patient on airborne precautions Will request pulmonology consult Place patient empirically on antibiotic therapy with Rocephin and Zithromax    DVT prophylaxis: SCD (Possible procedure in am) Code Status: Full code Family Communication: Greater than 30% of time was spent discussing plan of care with patient and his mother at the bedside. All questions and concerns have been addressed. They verbalized understanding and agree with the plan. Disposition Plan: Back to previous home environment Consults  called: Pulmonary    Jereme Loren MD [facility]     01/03/2020, 2:02 PM

## 2020-01-04 DIAGNOSIS — J189 Pneumonia, unspecified organism: Secondary | ICD-10-CM

## 2020-01-04 DIAGNOSIS — J984 Other disorders of lung: Secondary | ICD-10-CM

## 2020-01-04 LAB — BASIC METABOLIC PANEL
Anion gap: 7 (ref 5–15)
BUN: 16 mg/dL (ref 6–20)
CO2: 24 mmol/L (ref 22–32)
Calcium: 8.4 mg/dL — ABNORMAL LOW (ref 8.9–10.3)
Chloride: 109 mmol/L (ref 98–111)
Creatinine, Ser: 0.74 mg/dL (ref 0.61–1.24)
GFR, Estimated: >60 mL/min (ref >60)
Glucose, Bld: 93 mg/dL (ref 70–99)
Potassium: 3.6 mmol/L (ref 3.5–5.1)
Sodium: 140 mmol/L (ref 135–145)

## 2020-01-04 LAB — CBC
HCT: 38.3 % — ABNORMAL LOW (ref 39.0–52.0)
Hemoglobin: 12.6 g/dL — ABNORMAL LOW (ref 13.0–17.0)
MCH: 28.6 pg (ref 26.0–34.0)
MCHC: 32.9 g/dL (ref 30.0–36.0)
MCV: 86.8 fL (ref 80.0–100.0)
Platelets: 243 10*3/uL (ref 150–400)
RBC: 4.41 MIL/uL (ref 4.22–5.81)
RDW: 11.9 % (ref 11.5–15.5)
WBC: 9.5 10*3/uL (ref 4.0–10.5)
nRBC: 0 % (ref 0.0–0.2)

## 2020-01-04 LAB — ACID FAST SMEAR (AFB, MYCOBACTERIA): Acid Fast Smear: NEGATIVE

## 2020-01-04 LAB — GLUCOSE, CAPILLARY: Glucose-Capillary: 79 mg/dL (ref 70–99)

## 2020-01-04 LAB — CULTURE, BLOOD (ROUTINE X 2): Special Requests: ADEQUATE

## 2020-01-04 MED ORDER — NALOXONE HCL 0.4 MG/ML IJ SOLN: 0.4000 mg | INTRAMUSCULAR | Status: DC | PRN

## 2020-01-04 MED ORDER — OXYCODONE-ACETAMINOPHEN 5-325 MG PO TABS
1.0000 | ORAL_TABLET | Freq: Four times a day (QID) | ORAL | Status: DC | PRN
  Administered 2020-01-05 – 2020-01-06 (×3): 1 via ORAL
  Filled 2020-01-04 (×3): qty 1

## 2020-01-04 MED ORDER — OXYCODONE-ACETAMINOPHEN 5-325 MG PO TABS
1.0000 | ORAL_TABLET | Freq: Four times a day (QID) | ORAL | Status: DC | PRN
  Administered 2020-01-04 (×2): 1 via ORAL
  Filled 2020-01-04 (×2): qty 1

## 2020-01-04 MED ORDER — NALOXONE HCL 0.4 MG/ML IJ SOLN
INTRAMUSCULAR | Status: AC
  Filled 2020-01-04: qty 1

## 2020-01-04 NOTE — Progress Notes (Signed)
PROGRESS NOTE    Nathaniel Cooley  XTG:[id number] DOB: 29-Jun-1995 DOA: 01/03/2020 PCP: Patient, No Pcp Per    Assessment & Plan:   Active Problems:   Cavitary pneumonia  Cavitary pneumonia: etiology unclear. Will need to r/o TB. No known TB contacts. Continue w/ airborne precautions. Continue on IV rocephin, azithromycin. Legionella, mycoplasma, & strep ordered. Encourage incentive spirometry       DVT prophylaxis: SCDs Code Status: full  Family Communication:  Disposition Plan: likely d/c back home.  Status is: Inpatient  Remains inpatient appropriate because:Ongoing diagnostic testing needed not appropriate for outpatient work up and IV treatments appropriate due to intensity of illness or inability to take PO   Dispo: The patient is from: Home              Anticipated d/c is to: Home              Anticipated d/c date is: 3 days              Patient currently is not medically stable to d/c.         Consultants:   ID  pulmon   Procedures:    Antimicrobials: azithromycin, ceftriaxone   Subjective: Pt c/o cough  Objective: Vitals:   01/03/20 2017 01/03/20 2357 01/04/20 0418 01/04/20 0744  BP: 111/65 109/68 121/75 114/71  Pulse: (!) 56 (!) 51 (!) 52 (!) 52  Resp: 18 16 16    Temp: 98.1 F (36.7 C) 98.4 F (36.9 C) (!) 97.4 F (36.3 C) 97.7 F (36.5 C)  TempSrc: Oral Oral Oral Oral  SpO2: 98% 97% 100% 99%  Weight:      Height:        Intake/Output Summary (Last 24 hours) at 01/04/2020 0801 Last data filed at 01/03/2020 2214 Gross per 24 hour  Intake 350 ml  Output 300 ml  Net 50 ml   Filed Weights   01/03/20 1234  Weight: 77.1 kg    Examination:  General exam: Appears calm but uncomfortable  Respiratory system: course breath sounds  Cardiovascular system: S1 & S2 +. No ubs, gallops or clicks. No pedal edema. Gastrointestinal system: Abdomen is nondistended, soft and nontender.  Normal bowel sounds heard. Central nervous system:  Alert and oriented. Moves all 4 extremities Psychiatry: Judgement and insight appear normal. Flat mood and affect     Data Reviewed: I have personally reviewed following labs and imaging studies  CBC: Recent Labs  Lab 01/03/20 1238 01/04/20 0455  WBC 5.1 9.5  HGB 13.8 12.6*  HCT 41.9 38.3*  MCV 87.8 86.8  PLT 236 243   Basic Metabolic Panel: Recent Labs  Lab 01/03/20 1238 01/04/20 0455  NA 140 140  K 3.6 3.6  CL 107 109  CO2 23 24  GLUCOSE 84 93  BUN 19 16  CREATININE 0.90 0.74  CALCIUM 8.9 8.4*   GFR: Estimated Creatinine Clearance: 149 mL/min (by C-G formula based on SCr of 0.74 mg/dL). Liver Function Tests: Recent Labs  Lab 01/03/20 1238  AST 17  ALT 15  ALKPHOS 65  BILITOT 0.7  PROT 6.7  ALBUMIN 3.9   No results for input(s): LIPASE, AMYLASE in the last 168 hours. No results for input(s): AMMONIA in the last 168 hours. Coagulation Profile: No results for input(s): INR, PROTIME in the last 168 hours. Cardiac Enzymes: No results for input(s): CKTOTAL, CKMB, CKMBINDEX, TROPONINI in the last 168 hours. BNP (last 3 results) No results for input(s): PROBNP in the last 8760 hours.  HbA1C: No results for input(s): HGBA1C in the last 72 hours. CBG: No results for input(s): GLUCAP in the last 168 hours. Lipid Profile: No results for input(s): CHOL, HDL, LDLCALC, TRIG, CHOLHDL, LDLDIRECT in the last 72 hours. Thyroid Function Tests: No results for input(s): TSH, T4TOTAL, FREET4, T3FREE, THYROIDAB in the last 72 hours. Anemia Panel: No results for input(s): VITAMINB12, FOLATE, FERRITIN, TIBC, IRON, RETICCTPCT in the last 72 hours. Sepsis Labs: No results for input(s): PROCALCITON, LATICACIDVEN in the last 168 hours.  Recent Results (from the past 240 hour(s))  Blood culture (routine x 2)     Status: None (Preliminary result)   Collection Time: 01/03/20  1:40 PM   Specimen: BLOOD  Result Value Ref Range Status   Specimen Description BLOOD LEFT HAND  Final     Special Requests   Final    BOTTLES DRAWN AEROBIC AND ANAEROBIC Blood Culture results may not be optimal due to an inadequate volume of blood received in culture bottles   Culture   Final    NO GROWTH < 24 HOURS Performed at [facility], [address] [place], [place] [address]    Report Status PENDING  Incomplete  Blood culture (routine x 2)     Status: None (Preliminary result)   Collection Time: 01/03/20  1:40 PM   Specimen: BLOOD  Result Value Ref Range Status   Specimen Description BLOOD LEFT FORE ARM  Final   Special Requests   Final    BOTTLES DRAWN AEROBIC AND ANAEROBIC Blood Culture adequate volume   Culture   Final    NO GROWTH < 24 HOURS Performed at [facility], [address], [place], [place] [address]    Report Status PENDING  Incomplete  Resp Panel by RT-PCR (Flu A&B, Covid) Nasopharyngeal Swab     Status: None   Collection Time: 01/03/20  1:40 PM   Specimen: Nasopharyngeal Swab; Nasopharyngeal(NP) swabs in vial transport medium  Result Value Ref Range Status   SARS Coronavirus 2 by RT PCR NEGATIVE NEGATIVE Final    Comment: (NOTE) SARS-CoV-2 target nucleic acids are NOT DETECTED.  The SARS-CoV-2 RNA is generally detectable in upper respiratory specimens during the acute phase of infection. The lowest concentration of SARS-CoV-2 viral copies this assay can detect is 138 copies/mL. A negative result does not preclude SARS-Cov-2 infection and should not be used as the sole basis for treatment or other patient management decisions. A negative result may occur with  improper specimen collection/handling, submission of specimen other than nasopharyngeal swab, presence of viral mutation(s) within the areas targeted by this assay, and inadequate number of viral copies(<138 copies/mL). A negative result must be combined with clinical observations, patient history, and epidemiological information. The expected result is Negative.  Fact Sheet  for Patients:  BloggerCourse.com  Fact Sheet for Healthcare Providers:  SeriousBroker.it  This test is no t yet approved or cleared by the [place] FDA and  has been authorized for detection and/or diagnosis of SARS-CoV-2 by FDA under an Emergency Use Authorization (EUA). This EUA will remain  in effect (meaning this test can be used) for the duration of the COVID-19 declaration under Section 564(b)(1) of the Act, 21 U.S.C.section 360bbb-3(b)(1), unless the authorization is terminated  or revoked sooner.       Influenza A by PCR NEGATIVE NEGATIVE Final   Influenza B by PCR NEGATIVE NEGATIVE Final    Comment: (NOTE) The Xpert Xpress SARS-CoV-2/FLU/RSV plus assay is intended as an aid in the  diagnosis of influenza from Nasopharyngeal swab specimens and should not be used as a sole basis for treatment. Nasal washings and aspirates are unacceptable for Xpert Xpress SARS-CoV-2/FLU/RSV testing.  Fact Sheet for Patients: BloggerCourse.com  Fact Sheet for Healthcare Providers: SeriousBroker.it  This test is not yet approved or cleared by the [place] FDA and has been authorized for detection and/or diagnosis of SARS-CoV-2 by FDA under an Emergency Use Authorization (EUA). This EUA will remain in effect (meaning this test can be used) for the duration of the COVID-19 declaration under Section 564(b)(1) of the Act, 21 U.S.C. section 360bbb-3(b)(1), unless the authorization is terminated or revoked.  Performed at [facility], [address], [place], [place] [address]          Radiology Studies: DG Chest 2 View  Result Date: 01/03/2020 CLINICAL DATA:  Cough, pneumonia EXAM: CHEST - 2 VIEW COMPARISON:  01/02/2020 FINDINGS: The heart size and mediastinal contours are within normal limits. Vague right suprahilar opacity and subtle ground-glass opacities within  the periphery of the right upper lobe, better seen on previous CT. No new airspace consolidations. No pleural effusion or pneumothorax. The visualized skeletal structures are unremarkable. IMPRESSION: Vague right suprahilar opacity and subtle ground-glass opacities within the periphery of the right upper lobe, better seen on previous CT. No appreciable interval change. Electronically Signed   By: Duanne Guess D.O.   On: 01/03/2020 13:13   CT CHEST W CONTRAST  Result Date: 01/02/2020 CLINICAL DATA:  Cough, lung nodule. EXAM: CT CHEST WITH CONTRAST TECHNIQUE: Multidetector CT imaging of the chest was performed during intravenous contrast administration. CONTRAST:  53mL OMNIPAQUE IOHEXOL 300 MG/ML  SOLN COMPARISON:  December 30, 2019. FINDINGS: Cardiovascular: No significant vascular findings. Normal heart size. No pericardial effusion. Mediastinum/Nodes: No enlarged mediastinal, hilar, or axillary lymph nodes. Thyroid gland, trachea, and esophagus demonstrate no significant findings. Lungs/Pleura: No pneumothorax or pleural effusion is noted. Left lung is clear. 16 x 13 mm cavitating abnormality is noted posteriorly in the right upper lobe most consistent with cavitating pneumonia. At least 2 other densities are noted in the right upper lobe, with 1 measuring 12 x 7 mm just above the right minor fissure. These findings most likely represent multifocal inflammation or pneumonia. Upper Abdomen: No acute abnormality. Musculoskeletal: No chest wall abnormality. No acute or significant osseous findings. IMPRESSION: 16 x 13 mm cavitating abnormality is noted posteriorly in the right upper lobe most consistent with cavitating pneumonia. At least 2 other densities are noted in the right upper lobe, with 1 measuring 12 x 7 mm just above the right minor fissure. These findings most likely represent multifocal inflammation or pneumonia. Follow-up CT scan in 3-4 weeks is recommended to ensure resolution and rule out  underlying malignancy. Electronically Signed   By: Lupita Raider M.D.   On: 01/02/2020 12:44        Scheduled Meds: Continuous Infusions: . sodium chloride 75 mL/hr at 01/04/20 0749  . azithromycin Stopped (01/03/20 1707)  . cefTRIAXone (ROCEPHIN)  IV Stopped (01/03/20 1618)     LOS: 1 day    Time spent: 33 mins    Charise Killian, MD [facility] Pager [phone]  If 7PM-7AM, please contact night-coverage 01/04/2020, 8:01 AM

## 2020-01-04 NOTE — Progress Notes (Signed)
Initial Nutrition Assessment  DOCUMENTATION CODES:   Not applicable  INTERVENTION:  Once diet advanced, will order  -Ensure Enlive po BID, each supplement provides 350 kcal and 20 grams of protein (chocolate)  -MVI with minerals dialy  NUTRITION DIAGNOSIS:   Increased nutrient needs related to acute illness (cavitary pneumonia) as evidenced by estimated needs.    GOAL:   Patient will meet greater than or equal to 90% of their needs   MONITOR:   Weight trends, Supplement acceptance, Diet advancement, PO intake, Skin, I & O's, Labs  REASON FOR ASSESSMENT:   Malnutrition Screening Tool    ASSESSMENT:  24 year old male admitted for cavitary pneumonia.  Past medical history of COVID-19 virus infection in July 2021 presents with 2 weeks of left lateral chest wall pain and refractory nonproductive cough.  Patient sleeping soundly this afternoon, easily awakened with name call. Reports doing okay today, states that he is hungry and asking when he can eat. Patient reports good appetite at baseline, recalls eating a salad before going to work a few days ago, has had nothing to eat the last 48 hours. He endorses poor appetite as well as altered taste with Covid infection in July which resolved, however has recently noticed altered taste when eating chicken. He is unsure of weight changes, recalls usual weight ~170 lbs. Per chart, he weighed 75.3 kg (165.66 lbs) on 08/10/19, he weighed 73 kg (160.6 lbs) on 11/15 as well as on 12/30/19, and currently he weighs 77.1 kg (169.62 lbs) Suspect admit weight likely stated. He is agreeable to trying chocolate Ensure to aid with meeting needs once diet has been advanced.   Weight history reviewed, stable over the last year.  Medications reviewed and include: Zithromax, Rocephin IVF: NaCl @ 75 ml/hr  Labs reviewed   NUTRITION - FOCUSED PHYSICAL EXAM: Mild orbital, buccal fat depletion; Mild temple, clavicle, dorsal hand muscle depletion.    Diet Order:   Diet Order            Diet NPO time specified  Diet effective midnight                 EDUCATION NEEDS:   No education needs have been identified at this time  Skin:  Skin Assessment: Reviewed RN Assessment  Last BM:  11/24  Height:   Ht Readings from Last 1 Encounters:  01/03/20 5\' 11"  (1.803 m)    Weight:   Wt Readings from Last 1 Encounters:  01/03/20 77.1 kg   BMI:  Body mass index is 23.71 kg/m.  Estimated Nutritional Needs:   Kcal:  2200-2400  Protein:  115-125  Fluid:  >/= 2.2 L/day   01/05/20, RD, LDN Clinical Nutrition After Hours/Weekend Pager # in Amion

## 2020-01-04 NOTE — Consult Note (Signed)
NAME: Nathaniel Cooley  DOB: 12/16/1995  MRN: [id number]  Date/Time: 01/04/2020 2:20 PM  REQUESTING PROVIDER: williams Subjective:  REASON FOR CONSULT: Rt upper lobe cavitary lesions ? Nathaniel Cooley is a 24 y.o. male  with  ADHD, Covid infection in July 2021, presents with left sided chest wall pain and a refractory  cough of few weeks duration. Pt had COVID in July 2021says he had a mild cough 3 weeks ago,  Initially went to urgent care on 12/24/2019 complaining of a week of dry cough and was given cough syrup and Tessalon Perles after Covid test was done He returned to the urgent care on 12/30/2019 and was given doxycycline and prednisone. He came to the ED yesterday complaining of left-sided chest pain and cough. Vitals in the ED showed temperature of 98.5, BP 129/80, heart rate of 75 and sats of 100%. Baseline blood work was unremarkable. A CT chest ordered and done on 01/02/2020 showed a 69 to 13 mm cavitating abnormality in the posterior aspect of the right upper lobe consistent with cavitating pneumonia.  At least 2 of the densities were noted in the right upper lobe measuring 12 into 7 mm. Chest x-ray showed vague suprahilar opacity and subtle groundglass opacities within the periphery of the right upper lobe. 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 Went to [facility] 12/21/19 and came back on 12/23/2019.  He had used the hot tub at [facility].  He says the cough was present even before he went to [facility]. No contact with animals States customers at work to not wear mask. Has had some smell and taste changes recently. Hobbies include working on his car Covid test is negative Does not vape No alcohol, smokes marijuana sometimes Lives with girlfriend who is a Runner, broadcasting/film/video and also works at the knees during the weekend.  She is healthy. No tooth  extraction No choking on food   Past Medical History:  Diagnosis Date  . ADHD     Past surgical history Minor surgery to his right arm   Social History   Socioeconomic History  . Marital status: Single    Spouse name: Not on file  . Number of children: Not on file  . Years of education: Not on file  . Highest education level: Not on file  Occupational History  . Not on file  Tobacco Use  . Smoking status: Former Games developer  . Smokeless tobacco: Never Used  Vaping Use  . Vaping Use: Former  Substance and Sexual Activity  . Alcohol use: No  . Drug use: Not Currently    Types: Marijuana  . Sexual activity: Not on file  Other Topics Concern  . Not on file  Social History Narrative  . Not on file   Social Determinants of Health   Financial Resource Strain:   . Difficulty of Paying Living Expenses: Not on file  Food Insecurity:   . Worried About Programme researcher, broadcasting/film/video in the Last Year: Not on file  . Ran Out of Food in the Last Year: Not on file  Transportation Needs:   . Lack of Transportation (Medical): Not on file  . Lack of Transportation (Non-Medical): Not on file  Physical Activity:   . Days of Exercise per Week: Not on file  . Minutes of Exercise per Session: Not on file  Stress:   .  Feeling of Stress : Not on file  Social Connections:   . Frequency of Communication with Friends and Family: Not on file  . Frequency of Social Gatherings with Friends and Family: Not on file  . Attends Religious Services: Not on file  . Active Member of Clubs or Organizations: Not on file  . Attends Banker Meetings: Not on file  . Marital Status: Not on file  Intimate Partner Violence:   . Fear of Current or Ex-Partner: Not on file  . Emotionally Abused: Not on file  . Physically Abused: Not on file  . Sexually Abused: Not on file    Family History  Problem Relation Age of Onset  . Healthy Mother   . Diabetes Father    Allergies  Allergen Reactions  . Bee  Venom Swelling and Anaphylaxis  . Neosporin [Neomycin-Bacitracin Zn-Polymyx] Rash    ? Current Facility-Administered Medications  Medication Dose Route Frequency Provider Last Rate Last Admin  . 0.9 %  sodium chloride infusion   Intravenous Continuous [name], [name], MD 75 mL/hr at 01/04/20 0749 New Bag at 01/04/20 0749  . acetaminophen (TYLENOL) tablet 650 mg  650 mg Oral Q6H PRN Marikay Alar, FNP      . azithromycin (ZITHROMAX) 500 mg in sodium chloride 0.9 % 250 mL IVPB  500 mg Intravenous Q24H Lucile Shutters, MD   Stopped at 01/03/20 1707  . cefTRIAXone (ROCEPHIN) 2 g in sodium chloride 0.9 % 100 mL IVPB  2 g Intravenous Q24H Lucile Shutters, MD   Stopped at 01/03/20 1618  . traMADol (ULTRAM) tablet 50 mg  50 mg Oral Q12H PRN Marikay Alar, FNP   50 mg at 01/04/20 2130     Abtx:  Anti-infectives (From admission, onward)   Start     Dose/Rate Route Frequency Ordered Stop   01/03/20 1415  cefTRIAXone (ROCEPHIN) 2 g in sodium chloride 0.9 % 100 mL IVPB        2 g 200 mL/hr over 30 Minutes Intravenous Every 24 hours 01/03/20 1400 01/08/20 1359   01/03/20 1415  azithromycin (ZITHROMAX) 500 mg in sodium chloride 0.9 % 250 mL IVPB        500 mg 250 mL/hr over 60 Minutes Intravenous Every 24 hours 01/03/20 1400 01/08/20 1359      REVIEW OF SYSTEMS:  Const: negative fever, negative chills, negative weight loss Eyes: negative diplopia or visual changes, negative eye pain ENT: negative coryza, negative sore throat Resp: As above Cards: Left-sided chest pain, no palpitations, lower extremity edema GU: negative for frequency, dysuria and hematuria GI: Negative for abdominal pain, diarrhea, bleeding, constipation Skin: negative for rash and pruritus Heme: negative for easy bruising and gum/nose bleeding MS: negative for myalgias, arthralgias, back pain and muscle weakness Neurolo:negative for headaches, dizziness, vertigo, memory problems  Psych: negative for feelings of anxiety,  depression  Endocrine: negative for thyroid, diabetes Allergy/Immunology- negative for any medication or food allergies ?Today in the hospital had an episode of unresponsiveness following Norco use and had to be given Narcan with reversal.  Objective:  VITALS:  BP 117/62 (BP Location: Right Arm)   Pulse (!) 50   Temp 97.7 F (36.5 C) (Oral)   Resp 20   Ht 5\' 11"  (1.803 m)   Wt 77.1 kg   SpO2 98%   BMI 23.71 kg/m  PHYSICAL EXAM:  General: Alert, cooperative, no distress, appears stated age.  Head: Normocephalic, without obvious abnormality, atraumatic. Eyes: Conjunctivae clear, anicteric sclerae. Pupils are equal ENT  Nares normal. No drainage or sinus tenderness. Lips, mucosa, and tongue normal. No Thrush Neck: Supple, symmetrical, no adenopathy, thyroid: non tender no carotid bruit and no JVD. Back: No CVA tenderness. Lungs: Clear to auscultation bilaterally. No Wheezing or Rhonchi. No rales. Heart: Regular rate and rhythm, no murmur, rub or gallop. Abdomen: Soft, non-tender,not distended. Bowel sounds normal. No masses Extremities: atraumatic, no cyanosis. No edema. No clubbing Skin: No rashes or lesions. Or bruising Lymph: Cervical, supraclavicular normal. Neurologic: Grossly non-focal Pertinent Labs Lab Results CBC    Component Value Date/Time   WBC 9.5 01/04/2020 0455   RBC 4.41 01/04/2020 0455   HGB 12.6 (L) 01/04/2020 0455   HCT 38.3 (L) 01/04/2020 0455   PLT 243 01/04/2020 0455   MCV 86.8 01/04/2020 0455   MCH 28.6 01/04/2020 0455   MCHC 32.9 01/04/2020 0455   RDW 11.9 01/04/2020 0455    CMP Latest Ref Rng & Units 01/04/2020 01/03/2020  Glucose 70 - 99 mg/dL 93 84  BUN 6 - 20 mg/dL 16 19  Creatinine 0.09 - 1.24 mg/dL 3.81 8.29  Sodium 937 - 145 mmol/L 140 140  Potassium 3.5 - 5.1 mmol/L 3.6 3.6  Chloride 98 - 111 mmol/L 109 107  CO2 22 - 32 mmol/L 24 23  Calcium 8.9 - 10.3 mg/dL 1.6(R) 8.9  Total Protein 6.5 - 8.1 g/dL - 6.7  Total Bilirubin 0.3 - 1.2  mg/dL - 0.7  Alkaline Phos 38 - 126 U/L - 65  AST 15 - 41 U/L - 17  ALT 0 - 44 U/L - 15      Microbiology: Recent Results (from the past 240 hour(s))  Blood culture (routine x 2)     Status: None (Preliminary result)   Collection Time: 01/03/20  1:40 PM   Specimen: BLOOD  Result Value Ref Range Status   Specimen Description BLOOD LEFT HAND  Final   Special Requests   Final    BOTTLES DRAWN AEROBIC AND ANAEROBIC Blood Culture results may not be optimal due to an inadequate volume of blood received in culture bottles   Culture   Final    NO GROWTH < 24 HOURS Performed at [facility], [address], [place], [place] [address]    Report Status PENDING  Incomplete  Blood culture (routine x 2)     Status: None (Preliminary result)   Collection Time: 01/03/20  1:40 PM   Specimen: BLOOD  Result Value Ref Range Status   Specimen Description BLOOD LEFT FORE ARM  Final   Special Requests   Final    BOTTLES DRAWN AEROBIC AND ANAEROBIC Blood Culture adequate volume   Culture   Final    NO GROWTH < 24 HOURS Performed at [facility], [address], [place], [place] [address]    Report Status PENDING  Incomplete  Resp Panel by RT-PCR (Flu A&B, Covid) Nasopharyngeal Swab     Status: None   Collection Time: 01/03/20  1:40 PM   Specimen: Nasopharyngeal Swab; Nasopharyngeal(NP) swabs in vial transport medium  Result Value Ref Range Status   SARS Coronavirus 2 by RT PCR NEGATIVE NEGATIVE Final    Comment: (NOTE) SARS-CoV-2 target nucleic acids are NOT DETECTED.  The SARS-CoV-2 RNA is generally detectable in upper respiratory specimens during the acute phase of infection. The lowest concentration of SARS-CoV-2 viral copies this assay can detect is 138 copies/mL. A negative result does not preclude SARS-Cov-2 infection and should not be used as the sole basis for treatment  or other patient management decisions. A negative result may occur with  improper specimen  collection/handling, submission of specimen other than nasopharyngeal swab, presence of viral mutation(s) within the areas targeted by this assay, and inadequate number of viral copies(<138 copies/mL). A negative result must be combined with clinical observations, patient history, and epidemiological information. The expected result is Negative.  Fact Sheet for Patients:  BloggerCourse.comhttps://www.fda.gov/media/152166/download  Fact Sheet for Healthcare Providers:  SeriousBroker.ithttps://www.fda.gov/media/152162/download  This test is no t yet approved or cleared by the [place] FDA and  has been authorized for detection and/or diagnosis of SARS-CoV-2 by FDA under an Emergency Use Authorization (EUA). This EUA will remain  in effect (meaning this test can be used) for the duration of the COVID-19 declaration under Section 564(b)(1) of the Act, 21 U.S.C.section 360bbb-3(b)(1), unless the authorization is terminated  or revoked sooner.       Influenza A by PCR NEGATIVE NEGATIVE Final   Influenza B by PCR NEGATIVE NEGATIVE Final    Comment: (NOTE) The Xpert Xpress SARS-CoV-2/FLU/RSV plus assay is intended as an aid in the diagnosis of influenza from Nasopharyngeal swab specimens and should not be used as a sole basis for treatment. Nasal washings and aspirates are unacceptable for Xpert Xpress SARS-CoV-2/FLU/RSV testing.  Fact Sheet for Patients: BloggerCourse.comhttps://www.fda.gov/media/152166/download  Fact Sheet for Healthcare Providers: SeriousBroker.ithttps://www.fda.gov/media/152162/download  This test is not yet approved or cleared by the [place] FDA and has been authorized for detection and/or diagnosis of SARS-CoV-2 by FDA under an Emergency Use Authorization (EUA). This EUA will remain in effect (meaning this test can be used) for the duration of the COVID-19 declaration under Section 564(b)(1) of the Act, 21 U.S.C. section 360bbb-3(b)(1), unless the authorization is terminated or revoked.  Performed at [facility], [address] [place], [place] [id number]     IMAGING RESULTS:    16 x 13 mm cavitating abnormality is noted posteriorly in the right upper lobe most consistent with cavitating pneumonia. At least 2 other densities are noted in the right upper lobe, with 1 measuring 12 x 7 mm just above the right minor fissure. These findings most likely represent multifocal inflammation or pneumonia. Follow-up CT scan in 3-4 weeks is recommended to ensure resolution and rule out underlying malignancy. I have personally reviewed the films ? Impression/Recommendation ?Patient presenting with cough of more than 3 weeks duration.  CT scan shows small nodular lesions on the right upper lobe and 1 of which is cavitating. Patient denies prior sore throat. Atypical pneumonia could be bacteria versus atypical mycobacteria versus virus. Unlikely this is tuberculosis Agree with sepsis checking sputum for AFB Currently on ceftriaxone and azithromycin continue the same Get sputum for cultures. We may change the current antibiotics to Unasyn in a day or so. History of Covid in July 2021.  Current Covid test negative Discussed the management with the patient and his mother.  ? ___________________________________________________ Discussed with patient, requesting provider Note:  This document was prepared using Dragon voice recognition software and may include unintentional dictation errors.

## 2020-01-04 NOTE — Progress Notes (Signed)
AFB sputum x 1  sent to lab

## 2020-01-04 NOTE — Progress Notes (Signed)
Called to room by pts mother at 1720, patient shaking, trying to eat dinner, falling asleep between bites, wakes to touch and voice. Very lethargic. Rapid response team called.  Pt received oxycodone 5-325 at 1535 for pain 8/10, 1620 pain was 7/10 patient alert and oriented, given second dose.   Vital signs stable.  Given Narcan dose at 1725, patient awake and alert within 5 minutes. MD notified. Orders changed for oxycodone 5-325, may have 1 tablet q 6 hours instead of 1-2.

## 2020-01-04 NOTE — Significant Event (Signed)
Rapid Response Event Note   Reason for Call : Unarouseable  Initial Focused Assessment: Pt sleeping heavily, barely arouseable to sternal rub, went back to sleep as soon as stimulation stopped.  VSS on room air.  Per 2C CN Ree Kida, patient had received 2 Percocet prior and assigned RN was preparing to administer Narcan      Interventions:  Narcan administered, pt awakened and sat up in bed after 4-5 minutes, asking "what happened?"  Plan of Care:  Assigned RN will asked hospitalist to evaluate the Percocet order for possible discontinuation, and will report to night shift RN that pt should not receive narcotics    Event Summary:  Pt A&O when I departed, CN verbalized understanding to call RR again if needed  MD Notified: 1730  Call Time: 1724 Arrival Time: 1728 End Time:  1745  Claude Manges, RN

## 2020-01-04 NOTE — Progress Notes (Signed)
[facility] paged for rapid response; upon arrival medical team said situation is under control --> no needs at this time.

## 2020-01-05 DIAGNOSIS — J984 Other disorders of lung: Secondary | ICD-10-CM | POA: Diagnosis not present

## 2020-01-05 DIAGNOSIS — R001 Bradycardia, unspecified: Secondary | ICD-10-CM | POA: Diagnosis not present

## 2020-01-05 DIAGNOSIS — J189 Pneumonia, unspecified organism: Secondary | ICD-10-CM | POA: Diagnosis not present

## 2020-01-05 LAB — URINE DRUG SCREEN, QUALITATIVE (ARMC ONLY)
Amphetamines, Ur Screen: NOT DETECTED
Barbiturates, Ur Screen: NOT DETECTED
Benzodiazepine, Ur Scrn: NOT DETECTED
Cannabinoid 50 Ng, Ur ~~LOC~~: NOT DETECTED
Cocaine Metabolite,Ur ~~LOC~~: NOT DETECTED
MDMA (Ecstasy)Ur Screen: NOT DETECTED
Methadone Scn, Ur: NOT DETECTED
Opiate, Ur Screen: NOT DETECTED
Phencyclidine (PCP) Ur S: NOT DETECTED

## 2020-01-05 LAB — EXPECTORATED SPUTUM ASSESSMENT W GRAM STAIN, RFLX TO RESP C

## 2020-01-05 LAB — BASIC METABOLIC PANEL
Anion gap: 8 (ref 5–15)
BUN: 12 mg/dL (ref 6–20)
CO2: 24 mmol/L (ref 22–32)
Calcium: 7.9 mg/dL — ABNORMAL LOW (ref 8.9–10.3)
Chloride: 106 mmol/L (ref 98–111)
Creatinine, Ser: 0.73 mg/dL (ref 0.61–1.24)
GFR, Estimated: >60 mL/min (ref >60)
Glucose, Bld: 84 mg/dL (ref 70–99)
Potassium: 3.5 mmol/L (ref 3.5–5.1)
Sodium: 138 mmol/L (ref 135–145)

## 2020-01-05 LAB — STREP PNEUMONIAE URINARY ANTIGEN
Strep Pneumo Urinary Antigen: NEGATIVE
Strep Pneumo Urinary Antigen: NEGATIVE

## 2020-01-05 LAB — FERRITIN: Ferritin: 26 ng/mL (ref 24–336)

## 2020-01-05 LAB — CBC
HCT: 39.6 % (ref 39.0–52.0)
Hemoglobin: 12.8 g/dL — ABNORMAL LOW (ref 13.0–17.0)
MCH: 28.4 pg (ref 26.0–34.0)
MCHC: 32.3 g/dL (ref 30.0–36.0)
MCV: 87.8 fL (ref 80.0–100.0)
Platelets: 256 10*3/uL (ref 150–400)
RBC: 4.51 MIL/uL (ref 4.22–5.81)
RDW: 12 % (ref 11.5–15.5)
WBC: 6.5 10*3/uL (ref 4.0–10.5)
nRBC: 0 % (ref 0.0–0.2)

## 2020-01-05 LAB — PROCALCITONIN: Procalcitonin: <0.1 ng/mL

## 2020-01-05 LAB — SEDIMENTATION RATE: Sed Rate: 4 mm/hr (ref 0–15)

## 2020-01-05 NOTE — Progress Notes (Signed)
   01/05/20 1649  Vitals  Temp 97.6 F (36.4 C)  Temp Source Oral  BP 93/62  MAP (mmHg) 69  BP Location Left Arm  BP Method Automatic  Patient Position (if appropriate) Sitting  Pulse Rate (!) 59  Resp 18  MEWS COLOR  MEWS Score Color Green  Oxygen Therapy  SpO2 96 %  MEWS Score  MEWS Temp 0  MEWS Systolic 1  MEWS Pulse 0  MEWS RR 0  MEWS LOC 0  MEWS Score 1  patient states was awoken from sleep feeling like his heart was racing and going to vomit.  He also said he was shaking and feeling dizzy.  All symptoms have resolved with stopping azithromycin from infusing. Approximately 72ml left to infuse.  Patient adamant to stop infusion. MD notified of above.

## 2020-01-05 NOTE — Consult Note (Signed)
Pulmonary Medicine          Date: 01/05/2020,   MRN# [id number] Nathaniel Cooley Jul 02, 1995     AdmissionWeight: 77.1 kg                 CurrentWeight: 77.1 kg  Referring physician: Dr. Jimmye Norman    CHIEF COMPLAINT:   Cavitary pneumonia   HISTORY OF PRESENT ILLNESS   This is a pleasant 24 year old male with a history of herpetic gingivostomatitis, ADHD, seasonal allergies, COVID-19 + August 11, 2019, came in complaining of left chest discomfort and productive cough.  Reports recovery from COVID to baseline however and November 2021 developed cough visited urgent care with prednisone and antibiotic burst without improvement however did notice a chest x-ray at that time which was significant for right upper lobe infiltrate and a follow-up CT scan that shows a cavitary right upper lobe lesion.  His initial blood work was unrevealing with absence of leukocytosis normal platelets and hematocrit normal renal function and liver function.  CT chest reviewed independently by me with cavitary lesion of the right upper lobe worse at the posterior segment.   PAST MEDICAL HISTORY   Past Medical History:  Diagnosis Date  . ADHD      SURGICAL HISTORY   History reviewed. No pertinent surgical history.   FAMILY HISTORY   Family History  Problem Relation Age of Onset  . Healthy Mother   . Diabetes Father      SOCIAL HISTORY   Social History   Tobacco Use  . Smoking status: Former Research scientist (life sciences)  . Smokeless tobacco: Never Used  Vaping Use  . Vaping Use: Former  Substance Use Topics  . Alcohol use: No  . Drug use: Not Currently    Types: Marijuana     MEDICATIONS    Home Medication:    Current Medication:  Current Facility-Administered Medications:  .  0.9 %  sodium chloride infusion, , Intravenous, Continuous, [name], [name], MD, Last Rate: 75 mL/hr at 01/04/20 2251, New Bag at 01/04/20 2251 .  acetaminophen (TYLENOL) tablet 650 mg, 650 mg, Oral, Q6H PRN, Lang Snow, FNP .  azithromycin (ZITHROMAX) 500 mg in sodium chloride 0.9 % 250 mL IVPB, 500 mg, Intravenous, Q24H, [name], [name], MD, Last Rate: 250 mL/hr at 01/04/20 1700, Rate Verify at 01/04/20 1700 .  cefTRIAXone (ROCEPHIN) 2 g in sodium chloride 0.9 % 100 mL IVPB, 2 g, Intravenous, Q24H, [name], [name], MD, Stopped at 01/04/20 1609 .  naloxone [facility]) injection 0.4 mg, 0.4 mg, Intravenous, PRN, Wyvonnia Dusky, MD .  oxyCODONE-acetaminophen (PERCOCET/ROXICET) 5-325 MG per tablet 1 tablet, 1 tablet, Oral, Q6H PRN, Wyvonnia Dusky, MD, 1 tablet at 01/05/20 0931    ALLERGIES   Bee venom and Neosporin [neomycin-bacitracin zn-polymyx]     REVIEW OF SYSTEMS    Review of Systems:  Gen:  Denies  fever, sweats, chills weigh loss  HEENT: Denies blurred vision, double vision, ear pain, eye pain, hearing loss, nose bleeds, sore throat Cardiac:  No dizziness, chest pain or heaviness, chest tightness,edema Resp:   Denies cough or sputum porduction, shortness of breath,wheezing, hemoptysis,  Gi: Denies swallowing difficulty, stomach pain, nausea or vomiting, diarrhea, constipation, bowel incontinence Gu:  Denies bladder incontinence, burning urine Ext:   Denies Joint pain, stiffness or swelling Skin: Denies  skin rash, easy bruising or bleeding or hives Endoc:  Denies polyuria, polydipsia , polyphagia or weight change Psych:   Denies depression, insomnia or hallucinations   Other:  All other systems negative   VS: BP 105/63   Pulse (!) 55   Temp 97.9 F (36.6 C) (Oral)   Resp 16   Ht _0  (1.803 m)   Wt 77.1 kg   SpO2 98%   BMI 23.71 kg/m      PHYSICAL EXAM    GENERAL:NAD, no fevers, chills, no weakness no fatigue HEAD: Normocephalic, atraumatic.  EYES: Pupils equal, round, reactive to light. Extraocular muscles intact. No scleral icterus.  MOUTH: Moist mucosal membrane. Dentition intact. No abscess noted.  EAR, NOSE, THROAT: Clear without exudates. No  external lesions.  NECK: Supple. No thyromegaly. No nodules. No JVD.  PULMONARY: clear to auscultation , tender on left subcostal area  CARDIOVASCULAR: S1 and S2. Regular rate and rhythm. No murmurs, rubs, or gallops. No edema. Pedal pulses 2+ bilaterally.  GASTROINTESTINAL: Soft, nontender, nondistended. No masses. Positive bowel sounds. No hepatosplenomegaly.  MUSCULOSKELETAL: No swelling, clubbing, or edema. Range of motion full in all extremities.  NEUROLOGIC: Cranial nerves II through XII are intact. No gross focal neurological deficits. Sensation intact. Reflexes intact.  SKIN: No ulceration, lesions, rashes, or cyanosis. Skin warm and dry. Turgor intact.  PSYCHIATRIC: Mood, affect within normal limits. The patient is awake, alert and oriented x 3. Insight, judgment intact.       IMAGING    DG Chest 2 View  Result Date: 01/03/2020 CLINICAL DATA:  Cough, pneumonia EXAM: CHEST - 2 VIEW COMPARISON:  01/02/2020 FINDINGS: The heart size and mediastinal contours are within normal limits. Vague right suprahilar opacity and subtle ground-glass opacities within the periphery of the right upper lobe, better seen on previous CT. No new airspace consolidations. No pleural effusion or pneumothorax. The visualized skeletal structures are unremarkable. IMPRESSION: Vague right suprahilar opacity and subtle ground-glass opacities within the periphery of the right upper lobe, better seen on previous CT. No appreciable interval change. Electronically Signed   By: [name] D.O.   On: 01/03/2020 13:13   DG Chest 2 View  Result Date: 12/30/2019 CLINICAL DATA:  Pt reports worsening cough and chills x 2 weeks with no OTC cough medicine relief . Previous smoker. cough EXAM: CHEST - 2 VIEW COMPARISON:  None. FINDINGS: Is RIGHT suprahilar density measuring 2.5 cm. No pleural fluid. No peripheral airspace disease. No pneumothorax. No acute osseous abnormality. IMPRESSION: RIGHT suprahilar density.  Differential would include focal airspace disease, unusual vascular pattern, or less likely pulmonary parenchymal nodule. Recommend clinical correlation and consider CT of the thorax. At minimum recommend follow-up chest radiograph. Electronically Signed   By: Suzy Bouchard M.D.   On: 12/30/2019 13:16   CT CHEST W CONTRAST  Result Date: 01/02/2020 CLINICAL DATA:  Cough, lung nodule. EXAM: CT CHEST WITH CONTRAST TECHNIQUE: Multidetector CT imaging of the chest was performed during intravenous contrast administration. CONTRAST:  64m OMNIPAQUE IOHEXOL 300 MG/ML  SOLN COMPARISON:  December 30, 2019. FINDINGS: Cardiovascular: No significant vascular findings. Normal heart size. No pericardial effusion. Mediastinum/Nodes: No enlarged mediastinal, hilar, or axillary lymph nodes. Thyroid gland, trachea, and esophagus demonstrate no significant findings. Lungs/Pleura: No pneumothorax or pleural effusion is noted. Left lung is clear. 16 x 13 mm cavitating abnormality is noted posteriorly in the right upper lobe most consistent with cavitating pneumonia. At least 2 other densities are noted in the right upper lobe, with 1 measuring 12 x 7 mm just above the right minor fissure. These findings most likely represent multifocal inflammation or pneumonia. Upper Abdomen: No acute abnormality. Musculoskeletal: No chest wall  abnormality. No acute or significant osseous findings. IMPRESSION: 16 x 13 mm cavitating abnormality is noted posteriorly in the right upper lobe most consistent with cavitating pneumonia. At least 2 other densities are noted in the right upper lobe, with 1 measuring 12 x 7 mm just above the right minor fissure. These findings most likely represent multifocal inflammation or pneumonia. Follow-up CT scan in 3-4 weeks is recommended to ensure resolution and rule out underlying malignancy. Electronically Signed   By: Marijo Conception M.D.   On: 01/02/2020 12:44      ASSESSMENT/PLAN    Right upper lobe  cavitary pneumonia   - patient with no immunosuppression at baseline   - s/p COVID he was not a long hauler - he was not on prolonged immunomodulator/immunosuppresant but did have 1 short course of pred with doxy.    - he admits to smoking THC which raises possibility of inhalational exposure to endemic fungal elements - will send off serum fungitell and sputum cytology and KOH prep   -Patient does feel improved clinically with less pain/tenderness of Left flank   -He denies going to caves, denies vasculitis, denies asthma, denies rash,denies joint swelling or inflammation, I  have low suspicion for autoimmune disease related pulmonary cavitary lesion however will add ferritin and ESR as well as screening ANA due to absence of typical symptoms of pneumonia.   -will obtain urine drug screen as well as strep pneumo, histoplasma and legionella ag   - respiratory viral panel    -Infectious disease specialist is on case - appreciate input and noted workup - currently on CAP regimen - Rocephin and Zithromax with plan for transition to Unasyn   - noted RN documentation of narcosis with norcan treatment - have obtained urine drug screen - patient may aspirated to RUL although this is less likely statistically due to location.     - will continue to follow along with you     - have discussed potential need for bronchocopy in future patient is agreeable and thankful.      Thank you for allowing me to participate in the care of this patient.   Patient/Family are satisfied with care plan and all questions have been answered.  This document was prepared using Dragon voice recognition software and may include unintentional dictation errors.     Ottie Glazier, M.D.  Division of [place]

## 2020-01-05 NOTE — Progress Notes (Signed)
PROGRESS NOTE    Nathaniel Cooley  QJJ:[id number] DOB: 1995-09-15 DOA: 01/03/2020 PCP: Patient, No Pcp Per    Assessment & Plan:   Active Problems:   Cavitary pneumonia  Cavitary pneumonia: etiology unclear, possibly CAP. Checking sputum for AFB to r/o TB but TB is unlikely. No known TB contacts & not immunosuppressed. Continue w/ airborne precautions. Continue on IV rocephin, azithromycin as per ID. Fungitell, legionella, mycoplasma, & strep ordered. Encourage incentive spirometry.   Bradycardia: asymptomatic. Not on any BB or CCB. Will continue to monitor     DVT prophylaxis: SCDs Code Status: full  Family Communication:  Disposition Plan: likely d/c back home.  Status is: Inpatient  Remains inpatient appropriate because:Ongoing diagnostic testing needed not appropriate for outpatient work up and IV treatments appropriate due to intensity of illness or inability to take PO   Dispo: The patient is from: Home              Anticipated d/c is to: Home              Anticipated d/c date is: 3 days              Patient currently is not medically stable to d/c.         Consultants:   ID  pulmon   Procedures:    Antimicrobials: azithromycin, ceftriaxone   Subjective: Pt c/o cough, slightly improved from day prior.   Objective: Vitals:   01/04/20 1737 01/04/20 2026 01/04/20 2308 01/05/20 0452  BP: 129/88 112/62 114/70 (!) 100/57  Pulse: 72 (!) 56 (!) 50 (!) 46  Resp:  16 18 16   Temp:  (!) 97.5 F (36.4 C) 97.7 F (36.5 C) 97.6 F (36.4 C)  TempSrc:  Oral Oral Oral  SpO2: 99% 98% 99% 99%  Weight:      Height:        Intake/Output Summary (Last 24 hours) at 01/05/2020 0804 Last data filed at 01/04/2020 1700 Gross per 24 hour  Intake 1880 ml  Output 400 ml  Net 1480 ml   Filed Weights   01/03/20 1234  Weight: 77.1 kg    Examination:  General exam: Appears calm & comfortable  Respiratory system: diminished breath sounds b/l. No wheezes, rales  or rhonchi  Cardiovascular system: S1 & S2 +. No rubs, gallops or clicks  Gastrointestinal system: Abdomen is nondistended, soft and nontender.  Normal bowel sounds heard. Central nervous system: Alert and oriented. Moves all 4 extremities Psychiatry: judgement and insight appear normal. Flat mood and affect    Data Reviewed: I have personally reviewed following labs and imaging studies  CBC: Recent Labs  Lab 01/03/20 1238 01/04/20 0455 01/05/20 0433  WBC 5.1 9.5 6.5  HGB 13.8 12.6* 12.8*  HCT 41.9 38.3* 39.6  MCV 87.8 86.8 87.8  PLT 236 243 256   Basic Metabolic Panel: Recent Labs  Lab 01/03/20 1238 01/04/20 0455 01/05/20 0433  NA 140 140 138  K 3.6 3.6 3.5  CL 107 109 106  CO2 23 24 24   GLUCOSE 84 93 84  BUN 19 16 12   CREATININE 0.90 0.74 0.73  CALCIUM 8.9 8.4* 7.9*   GFR: Estimated Creatinine Clearance: 149 mL/min (by C-G formula based on SCr of 0.73 mg/dL). Liver Function Tests: Recent Labs  Lab 01/03/20 1238  AST 17  ALT 15  ALKPHOS 65  BILITOT 0.7  PROT 6.7  ALBUMIN 3.9   No results for input(s): LIPASE, AMYLASE in the last 168 hours. No results  for input(s): AMMONIA in the last 168 hours. Coagulation Profile: No results for input(s): INR, PROTIME in the last 168 hours. Cardiac Enzymes: No results for input(s): CKTOTAL, CKMB, CKMBINDEX, TROPONINI in the last 168 hours. BNP (last 3 results) No results for input(s): PROBNP in the last 8760 hours. HbA1C: No results for input(s): HGBA1C in the last 72 hours. CBG: Recent Labs  Lab 01/04/20 1725  GLUCAP 79   Lipid Profile: No results for input(s): CHOL, HDL, LDLCALC, TRIG, CHOLHDL, LDLDIRECT in the last 72 hours. Thyroid Function Tests: No results for input(s): TSH, T4TOTAL, FREET4, T3FREE, THYROIDAB in the last 72 hours. Anemia Panel: No results for input(s): VITAMINB12, FOLATE, FERRITIN, TIBC, IRON, RETICCTPCT in the last 72 hours. Sepsis Labs: Recent Labs  Lab 01/05/20 0433  PROCALCITON  <0.10    Recent Results (from the past 240 hour(s))  Blood culture (routine x 2)     Status: None (Preliminary result)   Collection Time: 01/03/20  1:40 PM   Specimen: BLOOD  Result Value Ref Range Status   Specimen Description BLOOD LEFT HAND  Final   Special Requests   Final    BOTTLES DRAWN AEROBIC AND ANAEROBIC Blood Culture results may not be optimal due to an inadequate volume of blood received in culture bottles   Culture   Final    NO GROWTH 2 DAYS Performed at [facility], [address], [place], [place] [address]    Report Status PENDING  Incomplete  Blood culture (routine x 2)     Status: None (Preliminary result)   Collection Time: 01/03/20  1:40 PM   Specimen: BLOOD  Result Value Ref Range Status   Specimen Description BLOOD LEFT FORE ARM  Final   Special Requests   Final    BOTTLES DRAWN AEROBIC AND ANAEROBIC Blood Culture adequate volume   Culture   Final    NO GROWTH 2 DAYS Performed at [facility], [address] [place], [place] [address]    Report Status PENDING  Incomplete  Resp Panel by RT-PCR (Flu A&B, Covid) Nasopharyngeal Swab     Status: None   Collection Time: 01/03/20  1:40 PM   Specimen: Nasopharyngeal Swab; Nasopharyngeal(NP) swabs in vial transport medium  Result Value Ref Range Status   SARS Coronavirus 2 by RT PCR NEGATIVE NEGATIVE Final    Comment: (NOTE) SARS-CoV-2 target nucleic acids are NOT DETECTED.  The SARS-CoV-2 RNA is generally detectable in upper respiratory specimens during the acute phase of infection. The lowest concentration of SARS-CoV-2 viral copies this assay can detect is 138 copies/mL. A negative result does not preclude SARS-Cov-2 infection and should not be used as the sole basis for treatment or other patient management decisions. A negative result may occur with  improper specimen collection/handling, submission of specimen other than nasopharyngeal swab, presence of viral mutation(s) within  the areas targeted by this assay, and inadequate number of viral copies(<138 copies/mL). A negative result must be combined with clinical observations, patient history, and epidemiological information. The expected result is Negative.  Fact Sheet for Patients:  BloggerCourse.com  Fact Sheet for Healthcare Providers:  SeriousBroker.it  This test is no t yet approved or cleared by the [place] FDA and  has been authorized for detection and/or diagnosis of SARS-CoV-2 by FDA under an Emergency Use Authorization (EUA). This EUA will remain  in effect (meaning this test can be used) for the duration of the COVID-19 declaration under Section 564(b)(1) of the Act, 21 U.S.C.section 360bbb-3(b)(1), unless  the authorization is terminated  or revoked sooner.       Influenza A by PCR NEGATIVE NEGATIVE Final   Influenza B by PCR NEGATIVE NEGATIVE Final    Comment: (NOTE) The Xpert Xpress SARS-CoV-2/FLU/RSV plus assay is intended as an aid in the diagnosis of influenza from Nasopharyngeal swab specimens and should not be used as a sole basis for treatment. Nasal washings and aspirates are unacceptable for Xpert Xpress SARS-CoV-2/FLU/RSV testing.  Fact Sheet for Patients: BloggerCourse.com  Fact Sheet for Healthcare Providers: SeriousBroker.it  This test is not yet approved or cleared by the [place] FDA and has been authorized for detection and/or diagnosis of SARS-CoV-2 by FDA under an Emergency Use Authorization (EUA). This EUA will remain in effect (meaning this test can be used) for the duration of the COVID-19 declaration under Section 564(b)(1) of the Act, 21 U.S.C. section 360bbb-3(b)(1), unless the authorization is terminated or revoked.  Performed at [facility], [address] [place], [place] [address]   Acid Fast Smear (AFB)     Status: None    Collection Time: 01/04/20 11:11 AM   Specimen: Sputum  Result Value Ref Range Status   AFB Specimen Processing Concentration  Final   Acid Fast Smear Negative  Final    Comment: (NOTE) Performed At: [facility] [address] [place], [place] [id number] Jolene Schimke MD EX:[id number]    Source (AFB) SPUTUM  Final    Comment: Performed at [facility], [address] [place], [place] [address]         Radiology Studies: DG Chest 2 View  Result Date: 01/03/2020 CLINICAL DATA:  Cough, pneumonia EXAM: CHEST - 2 VIEW COMPARISON:  01/02/2020 FINDINGS: The heart size and mediastinal contours are within normal limits. Vague right suprahilar opacity and subtle ground-glass opacities within the periphery of the right upper lobe, better seen on previous CT. No new airspace consolidations. No pleural effusion or pneumothorax. The visualized skeletal structures are unremarkable. IMPRESSION: Vague right suprahilar opacity and subtle ground-glass opacities within the periphery of the right upper lobe, better seen on previous CT. No appreciable interval change. Electronically Signed   By: Duanne Guess D.O.   On: 01/03/2020 13:13        Scheduled Meds: Continuous Infusions:  sodium chloride 75 mL/hr at 01/04/20 2251   azithromycin 250 mL/hr at 01/04/20 1700   cefTRIAXone (ROCEPHIN)  IV Stopped (01/04/20 1609)     LOS: 2 days    Time spent: 30 mins    Charise Killian, MD [facility] Pager [phone]  If 7PM-7AM, please contact night-coverage 01/05/2020, 8:04 AM

## 2020-01-05 NOTE — Progress Notes (Signed)
Patient on airborne precautions for r/o TB. Patient's mom is wanting to visit patient.  Informed patient and mom of airborne precaution and policy for no visitor (verified with [facility]). Also notified MD of mom and patient wanting to visit.  MD states per ID and pulmonologist the likelihood is low that test will be positive but encourages to follow hospital policy until results are in.  Notified mom and patient with apology for being uninformed of policy prior to coming.  Mom and patient were unstandably upset but cooperative.

## 2020-01-06 ENCOUNTER — Inpatient Hospital Stay: Payer: PRIVATE HEALTH INSURANCE

## 2020-01-06 DIAGNOSIS — R001 Bradycardia, unspecified: Secondary | ICD-10-CM | POA: Diagnosis not present

## 2020-01-06 DIAGNOSIS — J984 Other disorders of lung: Secondary | ICD-10-CM | POA: Diagnosis not present

## 2020-01-06 DIAGNOSIS — J189 Pneumonia, unspecified organism: Secondary | ICD-10-CM | POA: Diagnosis not present

## 2020-01-06 LAB — CBC
HCT: 40.9 % (ref 39.0–52.0)
Hemoglobin: 13.7 g/dL (ref 13.0–17.0)
MCH: 29.1 pg (ref 26.0–34.0)
MCHC: 33.5 g/dL (ref 30.0–36.0)
MCV: 86.8 fL (ref 80.0–100.0)
Platelets: 247 10*3/uL (ref 150–400)
RBC: 4.71 MIL/uL (ref 4.22–5.81)
RDW: 11.9 % (ref 11.5–15.5)
WBC: 6.4 10*3/uL (ref 4.0–10.5)
nRBC: 0 % (ref 0.0–0.2)

## 2020-01-06 LAB — BASIC METABOLIC PANEL
Anion gap: 9 (ref 5–15)
BUN: 11 mg/dL (ref 6–20)
CO2: 26 mmol/L (ref 22–32)
Calcium: 8.8 mg/dL — ABNORMAL LOW (ref 8.9–10.3)
Chloride: 106 mmol/L (ref 98–111)
Creatinine, Ser: 0.8 mg/dL (ref 0.61–1.24)
GFR, Estimated: >60 mL/min (ref >60)
Glucose, Bld: 108 mg/dL — ABNORMAL HIGH (ref 70–99)
Potassium: 3.7 mmol/L (ref 3.5–5.1)
Sodium: 141 mmol/L (ref 135–145)

## 2020-01-06 MED ORDER — AZITHROMYCIN 250 MG PO TABS
250.0000 mg | ORAL_TABLET | Freq: Every day | ORAL | Status: DC
  Administered 2020-01-07: 250 mg via ORAL
  Filled 2020-01-06: qty 1

## 2020-01-06 NOTE — Progress Notes (Signed)
PROGRESS NOTE    Nathaniel Cooley  YSA:[id number] DOB: Dec 23, 1995 DOA: 01/03/2020 PCP: Patient, No Pcp Per    Assessment & Plan:   Active Problems:   Cavitary pneumonia  Cavitary pneumonia: etiology unclear, likely CAP. Improving. Checking sputum for AFB to r/o TB but TB is unlikely. Not immunosuppressed and no known TB contacts. Continue w/ airborne precautions. Continue on IV rocephin, ceftriaxone as per ID. Possibly d/c on augmentin tomorrow if ok w/ ID. Fungitell, legionella, mycoplasma are pending. Strep is neg. Encourage incentive spirometry    Bradycardia: asymptomatic. Not on CCB or BB. Will continue to monitor     DVT prophylaxis: SCDs. Encourage ambulation  Code Status: full  Family Communication:  Disposition Plan: likely d/c back home.  Status is: Inpatient  Remains inpatient appropriate because:Ongoing diagnostic testing needed not appropriate for outpatient work up and IV treatments appropriate due to intensity of illness or inability to take PO   Dispo: The patient is from: Home              Anticipated d/c is to: Home              Anticipated d/c date is: 1 or 2 days              Patient currently is not medically stable to d/c.         Consultants:   ID  pulmon   Procedures:    Antimicrobials: azithromycin, ceftriaxone   Subjective: Pt c/o intermittent cough still  Objective: Vitals:   01/05/20 1558 01/05/20 1649 01/05/20 1950 01/05/20 2325  BP: 101/65 93/62 117/65 115/79  Pulse: 78 (!) 59 (!) 59 (!) 57  Resp: 18 18 18 16   Temp: 98.7 F (37.1 C) 97.6 F (36.4 C) 97.8 F (36.6 C) (!) 97.5 F (36.4 C)  TempSrc: Oral Oral Oral Oral  SpO2: 98% 96% 97% 99%  Weight:      Height:        Intake/Output Summary (Last 24 hours) at 01/06/2020 0748 Last data filed at 01/05/2020 2322 Gross per 24 hour  Intake --  Output 800 ml  Net -800 ml   Filed Weights   01/03/20 1234  Weight: 77.1 kg    Examination:  General exam: Appears  calm & comfortable  Respiratory system: decreased breath sounds b/l. No rales Cardiovascular system: S1/S2+. No rubs, clicks or gallops Gastrointestinal system: Abdomen is nondistended, soft and nontender.  Normal bowel sounds heard. Central nervous system: Alert and oriented. Moves all 4 extremities Psychiatry: Judgement & insight appear normal.Normal mood and affect    Data Reviewed: I have personally reviewed following labs and imaging studies  CBC: Recent Labs  Lab 01/03/20 1238 01/04/20 0455 01/05/20 0433 01/06/20 0324  WBC 5.1 9.5 6.5 6.4  HGB 13.8 12.6* 12.8* 13.7  HCT 41.9 38.3* 39.6 40.9  MCV 87.8 86.8 87.8 86.8  PLT 236 243 256 247   Basic Metabolic Panel: Recent Labs  Lab 01/03/20 1238 01/04/20 0455 01/05/20 0433 01/06/20 0324  NA 140 140 138 141  K 3.6 3.6 3.5 3.7  CL 107 109 106 106  CO2 23 24 24 26   GLUCOSE 84 93 84 108*  BUN 19 16 12 11   CREATININE 0.90 0.74 0.73 0.80  CALCIUM 8.9 8.4* 7.9* 8.8*   GFR: Estimated Creatinine Clearance: 149 mL/min (by C-G formula based on SCr of 0.8 mg/dL). Liver Function Tests: Recent Labs  Lab 01/03/20 1238  AST 17  ALT 15  ALKPHOS 65  BILITOT  0.7  PROT 6.7  ALBUMIN 3.9   No results for input(s): LIPASE, AMYLASE in the last 168 hours. No results for input(s): AMMONIA in the last 168 hours. Coagulation Profile: No results for input(s): INR, PROTIME in the last 168 hours. Cardiac Enzymes: No results for input(s): CKTOTAL, CKMB, CKMBINDEX, TROPONINI in the last 168 hours. BNP (last 3 results) No results for input(s): PROBNP in the last 8760 hours. HbA1C: No results for input(s): HGBA1C in the last 72 hours. CBG: Recent Labs  Lab 01/04/20 1725  GLUCAP 79   Lipid Profile: No results for input(s): CHOL, HDL, LDLCALC, TRIG, CHOLHDL, LDLDIRECT in the last 72 hours. Thyroid Function Tests: No results for input(s): TSH, T4TOTAL, FREET4, T3FREE, THYROIDAB in the last 72 hours. Anemia Panel: Recent Labs     01/05/20 1055  FERRITIN 26   Sepsis Labs: Recent Labs  Lab 01/05/20 0433  PROCALCITON <0.10    Recent Results (from the past 240 hour(s))  Blood culture (routine x 2)     Status: None (Preliminary result)   Collection Time: 01/03/20  1:40 PM   Specimen: BLOOD  Result Value Ref Range Status   Specimen Description BLOOD LEFT HAND  Final   Special Requests   Final    BOTTLES DRAWN AEROBIC AND ANAEROBIC Blood Culture results may not be optimal due to an inadequate volume of blood received in culture bottles   Culture   Final    NO GROWTH 3 DAYS Performed at [facility], [address], [place], [place] [address]    Report Status PENDING  Incomplete  Blood culture (routine x 2)     Status: None (Preliminary result)   Collection Time: 01/03/20  1:40 PM   Specimen: BLOOD  Result Value Ref Range Status   Specimen Description BLOOD LEFT FORE ARM  Final   Special Requests   Final    BOTTLES DRAWN AEROBIC AND ANAEROBIC Blood Culture adequate volume   Culture   Final    NO GROWTH 3 DAYS Performed at [facility], [address] [place], [place] [address]    Report Status PENDING  Incomplete  Resp Panel by RT-PCR (Flu A&B, Covid) Nasopharyngeal Swab     Status: None   Collection Time: 01/03/20  1:40 PM   Specimen: Nasopharyngeal Swab; Nasopharyngeal(NP) swabs in vial transport medium  Result Value Ref Range Status   SARS Coronavirus 2 by RT PCR NEGATIVE NEGATIVE Final    Comment: (NOTE) SARS-CoV-2 target nucleic acids are NOT DETECTED.  The SARS-CoV-2 RNA is generally detectable in upper respiratory specimens during the acute phase of infection. The lowest concentration of SARS-CoV-2 viral copies this assay can detect is 138 copies/mL. A negative result does not preclude SARS-Cov-2 infection and should not be used as the sole basis for treatment or other patient management decisions. A negative result may occur with  improper specimen collection/handling,  submission of specimen other than nasopharyngeal swab, presence of viral mutation(s) within the areas targeted by this assay, and inadequate number of viral copies(<138 copies/mL). A negative result must be combined with clinical observations, patient history, and epidemiological information. The expected result is Negative.  Fact Sheet for Patients:  BloggerCourse.com  Fact Sheet for Healthcare Providers:  SeriousBroker.it  This test is no t yet approved or cleared by the [place] FDA and  has been authorized for detection and/or diagnosis of SARS-CoV-2 by FDA under an Emergency Use Authorization (EUA). This EUA will remain  in effect (meaning this test can  be used) for the duration of the COVID-19 declaration under Section 564(b)(1) of the Act, 21 U.S.C.section 360bbb-3(b)(1), unless the authorization is terminated  or revoked sooner.       Influenza A by PCR NEGATIVE NEGATIVE Final   Influenza B by PCR NEGATIVE NEGATIVE Final    Comment: (NOTE) The Xpert Xpress SARS-CoV-2/FLU/RSV plus assay is intended as an aid in the diagnosis of influenza from Nasopharyngeal swab specimens and should not be used as a sole basis for treatment. Nasal washings and aspirates are unacceptable for Xpert Xpress SARS-CoV-2/FLU/RSV testing.  Fact Sheet for Patients: BloggerCourse.com  Fact Sheet for Healthcare Providers: SeriousBroker.it  This test is not yet approved or cleared by the [place] FDA and has been authorized for detection and/or diagnosis of SARS-CoV-2 by FDA under an Emergency Use Authorization (EUA). This EUA will remain in effect (meaning this test can be used) for the duration of the COVID-19 declaration under Section 564(b)(1) of the Act, 21 U.S.C. section 360bbb-3(b)(1), unless the authorization is terminated or revoked.  Performed at [facility], [address] [place], [place] [address]   Acid Fast Smear (AFB)     Status: None   Collection Time: 01/04/20 11:11 AM   Specimen: Sputum  Result Value Ref Range Status   AFB Specimen Processing Concentration  Final   Acid Fast Smear Negative  Final    Comment: (NOTE) Performed At: [facility] [address] [place], [place] [id number] Jolene Schimke MD XT:[id number]    Source (AFB) SPUTUM  Final    Comment: Performed at [facility], [address] [place], [place] [address]  Expectorated sputum assessment w rflx to resp cult     Status: None   Collection Time: 01/05/20 12:00 PM   Specimen: Sputum  Result Value Ref Range Status   Specimen Description SPUTUM  Final   Special Requests NONE  Final   Sputum evaluation   Final    Sputum specimen not acceptable for testing.  Please recollect.   [facility] ANN MITCHELL 01/05/20 AT 1234 BY ACR Performed at [facility], [address] [place], [place] [address]    Report Status 01/05/2020 FINAL  Final         Radiology Studies: No results found.      Scheduled Meds: Continuous Infusions: . azithromycin 500 mg (01/05/20 1530)  . cefTRIAXone (ROCEPHIN)  IV 2 g (01/05/20 1447)     LOS: 3 days    Time spent: 32 mins    Charise Killian, MD [facility] Pager [phone]  If 7PM-7AM, please contact night-coverage 01/06/2020, 7:48 AM

## 2020-01-06 NOTE — Progress Notes (Signed)
Pulmonary Medicine          Date: 01/06/2020,   MRN# [id number] Nathaniel Cooley [birth year]     AdmissionWeight: 77.1 kg                 CurrentWeight: 77.1 kg  Referring physician: Dr. Jimmye Norman    CHIEF COMPLAINT:   Cavitary pneumonia   HISTORY OF PRESENT ILLNESS   This is a pleasant 24 year old male with a history of herpetic gingivostomatitis, ADHD, seasonal allergies, COVID-19 + August 11, 2019, came in complaining of left chest discomfort and productive cough.  Reports recovery from COVID to baseline however and November 2021 developed cough visited urgent care with prednisone and antibiotic burst without improvement however did notice a chest x-ray at that time which was significant for right upper lobe infiltrate and a follow-up CT scan that shows a cavitary right upper lobe lesion.  His initial blood work was unrevealing with absence of leukocytosis normal platelets and hematocrit normal renal function and liver function.  CT chest reviewed independently by me with cavitary lesion of the right upper lobe worse at the posterior segment.  01/06/20- Patient with no overnight events.  He has been afebrile, walking around on room air inside his room. He speaks with non-labored breathing in full sentences and without cough. Respiratory cultures have to be repeated.  One AFB is negative. Blood work remains normal.  I discussed continuing remainder of therapy on outpatient and he is agreable. Will discuss with ID and maybe can initiate d/c planning. Will order PT/OT for now.    PAST MEDICAL HISTORY   Past Medical History:  Diagnosis Date  . ADHD      SURGICAL HISTORY   History reviewed. No pertinent surgical history.   FAMILY HISTORY   Family History  Problem Relation Age of Onset  . Healthy Mother   . Diabetes Father      SOCIAL HISTORY   Social History   Tobacco Use  . Smoking status: Former Research scientist (life sciences)  . Smokeless tobacco: Never Used  Vaping Use  .  Vaping Use: Former  Substance Use Topics  . Alcohol use: No  . Drug use: Not Currently    Types: Marijuana     MEDICATIONS    Home Medication:    Current Medication:  Current Facility-Administered Medications:  .  acetaminophen (TYLENOL) tablet 650 mg, 650 mg, Oral, Q6H PRN, Lang Snow, FNP .  azithromycin (ZITHROMAX) 500 mg in sodium chloride 0.9 % 250 mL IVPB, 500 mg, Intravenous, Q24H, [name], [name], MD, Last Rate: 250 mL/hr at 01/05/20 1530, 500 mg at 01/05/20 1530 .  cefTRIAXone (ROCEPHIN) 2 g in sodium chloride 0.9 % 100 mL IVPB, 2 g, Intravenous, Q24H, [name], [name], MD, Last Rate: 200 mL/hr at 01/06/20 0841, 2 g at 01/06/20 0841 .  naloxone [facility]) injection 0.4 mg, 0.4 mg, Intravenous, PRN, Wyvonnia Dusky, MD .  oxyCODONE-acetaminophen (PERCOCET/ROXICET) 5-325 MG per tablet 1 tablet, 1 tablet, Oral, Q6H PRN, Wyvonnia Dusky, MD, 1 tablet at 01/05/20 2318    ALLERGIES   Bee venom and Neosporin [neomycin-bacitracin zn-polymyx]     REVIEW OF SYSTEMS    Review of Systems:  Gen:  Denies  fever, sweats, chills weigh loss  HEENT: Denies blurred vision, double vision, ear pain, eye pain, hearing loss, nose bleeds, sore throat Cardiac:  No dizziness, chest pain or heaviness, chest tightness,edema Resp:   Denies cough or sputum porduction, shortness of breath,wheezing, hemoptysis,  Gi: Denies swallowing difficulty, stomach  pain, nausea or vomiting, diarrhea, constipation, bowel incontinence Gu:  Denies bladder incontinence, burning urine Ext:   Denies Joint pain, stiffness or swelling Skin: Denies  skin rash, easy bruising or bleeding or hives Endoc:  Denies polyuria, polydipsia , polyphagia or weight change Psych:   Denies depression, insomnia or hallucinations   Other:  All other systems negative   VS: BP 109/73   Pulse (!) 51   Temp (!) 97.5 F (36.4 C)   Resp 18   Ht 5' 11"  (1.803 m)   Wt 77.1 kg   SpO2 100%   BMI 23.71 kg/m       PHYSICAL EXAM    GENERAL:NAD, no fevers, chills, no weakness no fatigue HEAD: Normocephalic, atraumatic.  EYES: Pupils equal, round, reactive to light. Extraocular muscles intact. No scleral icterus.  MOUTH: Moist mucosal membrane. Dentition intact. No abscess noted.  EAR, NOSE, THROAT: Clear without exudates. No external lesions.  NECK: Supple. No thyromegaly. No nodules. No JVD.  PULMONARY: clear to auscultation , tender on left subcostal area  CARDIOVASCULAR: S1 and S2. Regular rate and rhythm. No murmurs, rubs, or gallops. No edema. Pedal pulses 2+ bilaterally.  GASTROINTESTINAL: Soft, nontender, nondistended. No masses. Positive bowel sounds. No hepatosplenomegaly.  MUSCULOSKELETAL: No swelling, clubbing, or edema. Range of motion full in all extremities.  NEUROLOGIC: Cranial nerves II through XII are intact. No gross focal neurological deficits. Sensation intact. Reflexes intact.  SKIN: No ulceration, lesions, rashes, or cyanosis. Skin warm and dry. Turgor intact.  PSYCHIATRIC: Mood, affect within normal limits. The patient is awake, alert and oriented x 3. Insight, judgment intact.       IMAGING    DG Chest 2 View  Result Date: 01/03/2020 CLINICAL DATA:  Cough, pneumonia EXAM: CHEST - 2 VIEW COMPARISON:  01/02/2020 FINDINGS: The heart size and mediastinal contours are within normal limits. Vague right suprahilar opacity and subtle ground-glass opacities within the periphery of the right upper lobe, better seen on previous CT. No new airspace consolidations. No pleural effusion or pneumothorax. The visualized skeletal structures are unremarkable. IMPRESSION: Vague right suprahilar opacity and subtle ground-glass opacities within the periphery of the right upper lobe, better seen on previous CT. No appreciable interval change. Electronically Signed   By: [name] D.O.   On: 01/03/2020 13:13   DG Chest 2 View  Result Date: 12/30/2019 CLINICAL DATA:  Pt reports  worsening cough and chills x 2 weeks with no OTC cough medicine relief . Previous smoker. cough EXAM: CHEST - 2 VIEW COMPARISON:  None. FINDINGS: Is RIGHT suprahilar density measuring 2.5 cm. No pleural fluid. No peripheral airspace disease. No pneumothorax. No acute osseous abnormality. IMPRESSION: RIGHT suprahilar density. Differential would include focal airspace disease, unusual vascular pattern, or less likely pulmonary parenchymal nodule. Recommend clinical correlation and consider CT of the thorax. At minimum recommend follow-up chest radiograph. Electronically Signed   By: Suzy Bouchard M.D.   On: 12/30/2019 13:16   CT CHEST W CONTRAST  Result Date: 01/02/2020 CLINICAL DATA:  Cough, lung nodule. EXAM: CT CHEST WITH CONTRAST TECHNIQUE: Multidetector CT imaging of the chest was performed during intravenous contrast administration. CONTRAST:  4m OMNIPAQUE IOHEXOL 300 MG/ML  SOLN COMPARISON:  December 30, 2019. FINDINGS: Cardiovascular: No significant vascular findings. Normal heart size. No pericardial effusion. Mediastinum/Nodes: No enlarged mediastinal, hilar, or axillary lymph nodes. Thyroid gland, trachea, and esophagus demonstrate no significant findings. Lungs/Pleura: No pneumothorax or pleural effusion is noted. Left lung is clear. 16 x 13 mm cavitating  abnormality is noted posteriorly in the right upper lobe most consistent with cavitating pneumonia. At least 2 other densities are noted in the right upper lobe, with 1 measuring 12 x 7 mm just above the right minor fissure. These findings most likely represent multifocal inflammation or pneumonia. Upper Abdomen: No acute abnormality. Musculoskeletal: No chest wall abnormality. No acute or significant osseous findings. IMPRESSION: 16 x 13 mm cavitating abnormality is noted posteriorly in the right upper lobe most consistent with cavitating pneumonia. At least 2 other densities are noted in the right upper lobe, with 1 measuring 12 x 7 mm just  above the right minor fissure. These findings most likely represent multifocal inflammation or pneumonia. Follow-up CT scan in 3-4 weeks is recommended to ensure resolution and rule out underlying malignancy. Electronically Signed   By: Marijo Conception M.D.   On: 01/02/2020 12:44      ASSESSMENT/PLAN    Right upper lobe cavitary pneumonia   - patient with no immunosuppression at baseline   - s/p COVID he was not a long hauler - he was not on prolonged immunomodulator/immunosuppresant but did have 1 short course of pred with doxy.    - he admits to smoking THC which raises possibility of inhalational exposure to endemic fungal elements - will send off serum fungitell and sputum cytology and KOH prep   -Patient does feel improved clinically with less pain/tenderness of Left flank   -He denies going to caves, denies vasculitis, denies asthma, denies rash,denies joint swelling or inflammation, I  have low suspicion for autoimmune disease related pulmonary cavitary lesion however will add ferritin and ESR as well as screening ANA due to absence of typical symptoms of pneumonia.   -will obtain urine drug screen as well as strep pneumo, histoplasma and legionella ag   - respiratory viral panel    -Infectious disease specialist is on case - appreciate input and noted workup - currently on CAP regimen - Rocephin and Zithromax with plan for transition to Unasyn>>Augmentin?   - noted RN documentation of narcosis with norcan treatment - have obtained urine drug screen - patient may aspirated to RUL although this is less likely statistically due to location.     - will continue to follow along with you     - have discussed potential need for bronchocopy in future patient is agreeable and thankful.      Thank you for allowing me to participate in the care of this patient.   Patient/Family are satisfied with care plan and all questions have been answered.  This document was prepared using Dragon voice  recognition software and may include unintentional dictation errors.     Ottie Glazier, M.D.  Division of [place]

## 2020-01-07 DIAGNOSIS — R059 Cough, unspecified: Secondary | ICD-10-CM | POA: Diagnosis not present

## 2020-01-07 DIAGNOSIS — J189 Pneumonia, unspecified organism: Secondary | ICD-10-CM | POA: Diagnosis not present

## 2020-01-07 DIAGNOSIS — J984 Other disorders of lung: Secondary | ICD-10-CM | POA: Diagnosis not present

## 2020-01-07 DIAGNOSIS — R001 Bradycardia, unspecified: Secondary | ICD-10-CM | POA: Diagnosis not present

## 2020-01-07 LAB — CBC
HCT: 43.9 % (ref 39.0–52.0)
Hemoglobin: 14.7 g/dL (ref 13.0–17.0)
MCH: 28.8 pg (ref 26.0–34.0)
MCHC: 33.5 g/dL (ref 30.0–36.0)
MCV: 86.1 fL (ref 80.0–100.0)
Platelets: 267 10*3/uL (ref 150–400)
RBC: 5.1 MIL/uL (ref 4.22–5.81)
RDW: 11.9 % (ref 11.5–15.5)
WBC: 7 10*3/uL (ref 4.0–10.5)
nRBC: 0 % (ref 0.0–0.2)

## 2020-01-07 LAB — BASIC METABOLIC PANEL
Anion gap: 10 (ref 5–15)
BUN: 12 mg/dL (ref 6–20)
CO2: 26 mmol/L (ref 22–32)
Calcium: 9 mg/dL (ref 8.9–10.3)
Chloride: 103 mmol/L (ref 98–111)
Creatinine, Ser: 0.82 mg/dL (ref 0.61–1.24)
GFR, Estimated: >60 mL/min (ref >60)
Glucose, Bld: 96 mg/dL (ref 70–99)
Potassium: 3.9 mmol/L (ref 3.5–5.1)
Sodium: 139 mmol/L (ref 135–145)

## 2020-01-07 LAB — ANA COMPREHENSIVE PANEL

## 2020-01-07 LAB — MYCOPLASMA PNEUMONIAE ANTIBODY, IGM: Mycoplasma pneumo IgM: <770 U/mL (ref 0–769)

## 2020-01-07 LAB — LEGIONELLA PNEUMOPHILA SEROGP 1 UR AG
L. pneumophila Serogp 1 Ur Ag: NEGATIVE
L. pneumophila Serogp 1 Ur Ag: NEGATIVE

## 2020-01-07 LAB — HISTOPLASMA ANTIGEN, URINE: Histoplasma Antigen, urine: <0.5 (ref <0.5)

## 2020-01-07 MED ORDER — ADULT MULTIVITAMIN W/MINERALS CH: 1.0000 | ORAL_TABLET | Freq: Every day | ORAL | Status: DC

## 2020-01-07 MED ORDER — ENSURE ENLIVE PO LIQD
237.0000 mL | Freq: Three times a day (TID) | ORAL | Status: DC
  Administered 2020-01-07: 237 mL via ORAL

## 2020-01-07 MED ORDER — OXYCODONE-ACETAMINOPHEN 5-325 MG PO TABS: 1.0000 | ORAL_TABLET | Freq: Four times a day (QID) | ORAL | 0 refills | Status: AC | PRN

## 2020-01-07 MED ORDER — SODIUM CHLORIDE 0.9 % IV SOLN
1.5000 g | Freq: Four times a day (QID) | INTRAVENOUS | Status: DC
  Administered 2020-01-07: 1.5 g via INTRAVENOUS
  Filled 2020-01-07: qty 1.5
  Filled 2020-01-07 (×2): qty 4

## 2020-01-07 MED ORDER — AMOXICILLIN-POT CLAVULANATE 875-125 MG PO TABS: 1.0000 | ORAL_TABLET | Freq: Two times a day (BID) | ORAL | 0 refills | Status: AC

## 2020-01-07 NOTE — Progress Notes (Signed)
ID Feeling better Less cough No fever No sob o/e Patient Vitals for the past 24 hrs:  BP Temp Temp src Pulse Resp SpO2  01/07/20 0734 118/68 98 F (36.7 C) Oral (!) 48 19 99 %  01/07/20 0541 117/73 97.6 F (36.4 C) Oral (!) 58 18 100 %  01/06/20 1900 129/82 98.1 F (36.7 C) Oral (!) 57 18 100 %   Awake and alert Chest b/l air entry [id number] abd soft CNS non focal  CBC Latest Ref Rng & Units 01/07/2020 01/06/2020 01/05/2020  WBC 4.0 - 10.5 K/uL 7.0 6.4 6.5  Hemoglobin 13.0 - 17.0 g/dL 88.4 16.6 12.8(L)  Hematocrit 39 - 52 % 43.9 40.9 39.6  Platelets 150 - 400 K/uL 267 247 256    CMP Latest Ref Rng & Units 01/07/2020 01/06/2020 01/05/2020  Glucose 70 - 99 mg/dL 96 063(K) 84  BUN 6 - 20 mg/dL 12 11 12   Creatinine 0.61 - 1.24 mg/dL 1.60 1.09  Sodium 135 - 145 mmol/L 139 141 138  Potassium 3.5 - 5.1 mmol/L 3.9 3.7 3.5  Chloride 98 - 111 mmol/L 103 106 106  CO2 22 - 32 mmol/L 26 26 24   Calcium 8.9 - 10.3 mg/dL 9.0 3.23) 7.9(L)  Total Protein 6.5 - 8.1 g/dL - - -  Total Bilirubin 0.3 - 1.2 mg/dL - - -  Alkaline Phos 38 - 126 U/L - - -  AST 15 - 41 U/L - - -  ALT 0 - 44 U/L - - -    Impression/recommendation Patient presenting with cough of more than 3 weeks duration.  CT scan shows small nodular lesions on the right upper lobe and 1 of which is cavitating. Patient denies prior sore throat. Atypical pneumonia could be bacteria versus atypical mycobacteria versus virus. Unlikely this is tuberculosis Afb neg  So far other tests sent for fungal infections pending Currently on ceftriaxone and azithromycin . change the current antibiotics to augmentn for 2 more weeks  History of Covid in July 2021.  Current Covid test negative Discussed the management with the patient and care team. Follow up with Dr.Aleskerov as Op

## 2020-01-07 NOTE — Progress Notes (Addendum)
Secure chat to Dr. Mayford Knife to advise of pulse rate of 48 on am vitals. Patient asymptomatic.   [id number] - MD returned message, no new orders.

## 2020-01-07 NOTE — Progress Notes (Signed)
Pulmonary Medicine          Date: 01/07/2020,   MRN# [id number] Nathaniel Cooley 05-30-1995     AdmissionWeight: 77.1 kg                 CurrentWeight: 77.1 kg  Referring physician: Dr. Jimmye Norman    CHIEF COMPLAINT:   Cavitary pneumonia   HISTORY OF PRESENT ILLNESS   This is a pleasant 24 year old male with a history of herpetic gingivostomatitis, ADHD, seasonal allergies, COVID-19 + August 11, 2019, came in complaining of left chest discomfort and productive cough.  Reports recovery from COVID to baseline however and November 2021 developed cough visited urgent care with prednisone and antibiotic burst without improvement however did notice a chest x-ray at that time which was significant for right upper lobe infiltrate and a follow-up CT scan that shows a cavitary right upper lobe lesion.  His initial blood work was unrevealing with absence of leukocytosis normal platelets and hematocrit normal renal function and liver function.  CT chest reviewed independently by me with cavitary lesion of the right upper lobe worse at the posterior segment.  01/06/20- Patient with no overnight events.  He has been afebrile, walking around on room air inside his room. He speaks with non-labored breathing in full sentences and without cough. Respiratory cultures have to be repeated.  One AFB is negative. Blood work remains normal.  I discussed continuing remainder of therapy on outpatient and he is agreable. Will discuss with ID and maybe can initiate d/c planning. Will order PT/OT for now.    PAST MEDICAL HISTORY   Past Medical History:  Diagnosis Date  . ADHD      SURGICAL HISTORY   History reviewed. No pertinent surgical history.   FAMILY HISTORY   Family History  Problem Relation Age of Onset  . Healthy Mother   . Diabetes Father      SOCIAL HISTORY   Social History   Tobacco Use  . Smoking status: Former Research scientist (life sciences)  . Smokeless tobacco: Never Used  Vaping Use  .  Vaping Use: Former  Substance Use Topics  . Alcohol use: No  . Drug use: Not Currently    Types: Marijuana     MEDICATIONS    Home Medication:    Current Medication:  Current Facility-Administered Medications:  .  acetaminophen (TYLENOL) tablet 650 mg, 650 mg, Oral, Q6H PRN, Lang Snow, FNP .  azithromycin [facility]) tablet 250 mg, 250 mg, Oral, Daily, Wyvonnia Dusky, MD, 250 mg at 01/07/20 0110 .  cefTRIAXone (ROCEPHIN) 2 g in sodium chloride 0.9 % 100 mL IVPB, 2 g, Intravenous, Q24H, [name], [name], MD, Last Rate: 200 mL/hr at 01/06/20 0841, 2 g at 01/06/20 0841 .  naloxone [facility]) injection 0.4 mg, 0.4 mg, Intravenous, PRN, Wyvonnia Dusky, MD .  oxyCODONE-acetaminophen (PERCOCET/ROXICET) 5-325 MG per tablet 1 tablet, 1 tablet, Oral, Q6H PRN, Wyvonnia Dusky, MD, 1 tablet at 01/06/20 2003    ALLERGIES   Bee venom and Neosporin [neomycin-bacitracin zn-polymyx]     REVIEW OF SYSTEMS    Review of Systems:  Gen:  Denies  fever, sweats, chills weigh loss  HEENT: Denies blurred vision, double vision, ear pain, eye pain, hearing loss, nose bleeds, sore throat Cardiac:  No dizziness, chest pain or heaviness, chest tightness,edema Resp:   Denies cough or sputum porduction, shortness of breath,wheezing, hemoptysis,  Gi: Denies swallowing difficulty, stomach pain, nausea or vomiting, diarrhea, constipation, bowel incontinence Gu:  Denies bladder incontinence,  burning urine Ext:   Denies Joint pain, stiffness or swelling Skin: Denies  skin rash, easy bruising or bleeding or hives Endoc:  Denies polyuria, polydipsia , polyphagia or weight change Psych:   Denies depression, insomnia or hallucinations   Other:  All other systems negative   VS: BP 118/68 (BP Location: Left Arm)   Pulse (!) 48   Temp 98 F (36.7 C) (Oral)   Resp 19   Ht 5' 11"  (1.803 m)   Wt 77.1 kg   SpO2 99%   BMI 23.71 kg/m      PHYSICAL EXAM    GENERAL:NAD, no fevers,  chills, no weakness no fatigue HEAD: Normocephalic, atraumatic.  EYES: Pupils equal, round, reactive to light. Extraocular muscles intact. No scleral icterus.  MOUTH: Moist mucosal membrane. Dentition intact. No abscess noted.  EAR, NOSE, THROAT: Clear without exudates. No external lesions.  NECK: Supple. No thyromegaly. No nodules. No JVD.  PULMONARY: clear to auscultation , tender on left subcostal area  CARDIOVASCULAR: S1 and S2. Regular rate and rhythm. No murmurs, rubs, or gallops. No edema. Pedal pulses 2+ bilaterally.  GASTROINTESTINAL: Soft, nontender, nondistended. No masses. Positive bowel sounds. No hepatosplenomegaly.  MUSCULOSKELETAL: No swelling, clubbing, or edema. Range of motion full in all extremities.  NEUROLOGIC: Cranial nerves II through XII are intact. No gross focal neurological deficits. Sensation intact. Reflexes intact.  SKIN: No ulceration, lesions, rashes, or cyanosis. Skin warm and dry. Turgor intact.  PSYCHIATRIC: Mood, affect within normal limits. The patient is awake, alert and oriented x 3. Insight, judgment intact.       IMAGING    DG Chest 2 View  Result Date: 01/03/2020 CLINICAL DATA:  Cough, pneumonia EXAM: CHEST - 2 VIEW COMPARISON:  01/02/2020 FINDINGS: The heart size and mediastinal contours are within normal limits. Vague right suprahilar opacity and subtle ground-glass opacities within the periphery of the right upper lobe, better seen on previous CT. No new airspace consolidations. No pleural effusion or pneumothorax. The visualized skeletal structures are unremarkable. IMPRESSION: Vague right suprahilar opacity and subtle ground-glass opacities within the periphery of the right upper lobe, better seen on previous CT. No appreciable interval change. Electronically Signed   By: [name] D.O.   On: 01/03/2020 13:13   DG Chest 2 View  Result Date: 12/30/2019 CLINICAL DATA:  Pt reports worsening cough and chills x 2 weeks with no OTC cough  medicine relief . Previous smoker. cough EXAM: CHEST - 2 VIEW COMPARISON:  None. FINDINGS: Is RIGHT suprahilar density measuring 2.5 cm. No pleural fluid. No peripheral airspace disease. No pneumothorax. No acute osseous abnormality. IMPRESSION: RIGHT suprahilar density. Differential would include focal airspace disease, unusual vascular pattern, or less likely pulmonary parenchymal nodule. Recommend clinical correlation and consider CT of the thorax. At minimum recommend follow-up chest radiograph. Electronically Signed   By: Suzy Bouchard M.D.   On: 12/30/2019 13:16   CT CHEST W CONTRAST  Result Date: 01/02/2020 CLINICAL DATA:  Cough, lung nodule. EXAM: CT CHEST WITH CONTRAST TECHNIQUE: Multidetector CT imaging of the chest was performed during intravenous contrast administration. CONTRAST:  64m OMNIPAQUE IOHEXOL 300 MG/ML  SOLN COMPARISON:  December 30, 2019. FINDINGS: Cardiovascular: No significant vascular findings. Normal heart size. No pericardial effusion. Mediastinum/Nodes: No enlarged mediastinal, hilar, or axillary lymph nodes. Thyroid gland, trachea, and esophagus demonstrate no significant findings. Lungs/Pleura: No pneumothorax or pleural effusion is noted. Left lung is clear. 16 x 13 mm cavitating abnormality is noted posteriorly in the right upper lobe  most consistent with cavitating pneumonia. At least 2 other densities are noted in the right upper lobe, with 1 measuring 12 x 7 mm just above the right minor fissure. These findings most likely represent multifocal inflammation or pneumonia. Upper Abdomen: No acute abnormality. Musculoskeletal: No chest wall abnormality. No acute or significant osseous findings. IMPRESSION: 16 x 13 mm cavitating abnormality is noted posteriorly in the right upper lobe most consistent with cavitating pneumonia. At least 2 other densities are noted in the right upper lobe, with 1 measuring 12 x 7 mm just above the right minor fissure. These findings most likely  represent multifocal inflammation or pneumonia. Follow-up CT scan in 3-4 weeks is recommended to ensure resolution and rule out underlying malignancy. Electronically Signed   By: Marijo Conception M.D.   On: 01/02/2020 12:44   DG Chest Port 1 View  Result Date: 01/06/2020 CLINICAL DATA:  Follow-up pneumonia. EXAM: PORTABLE CHEST 1 VIEW COMPARISON:  01/03/2020 FINDINGS: The heart size and mediastinal contours are within normal limits. Both lungs are clear. The visualized skeletal structures are unremarkable. IMPRESSION: No active disease. Electronically Signed   By: Kerby Moors M.D.   On: 01/06/2020 11:43           ASSESSMENT/PLAN    Right upper lobe cavitary pneumonia   - patient with no immunosuppression at baseline   - s/p COVID he was not a long hauler - he was not on prolonged immunomodulator/immunosuppresant but did have 1 short course of pred with doxy.    - he admits to smoking THC which raises possibility of inhalational exposure to endemic fungal elements - will send off serum fungitell and sputum cytology and KOH prep   -Patient does feel improved clinically with less pain/tenderness of Left flank   -He denies going to caves, denies vasculitis, denies asthma, denies rash,denies joint swelling or inflammation, I  have low suspicion for autoimmune disease related pulmonary cavitary lesion however will add ferritin and ESR as well as screening ANA due to absence of typical symptoms of pneumonia.   -will obtain urine drug screen as well as strep pneumo, histoplasma and legionella ag   - respiratory viral panel    -Infectious disease specialist is on case - appreciate input and noted workup - currently on CAP regimen - Rocephin and Zithromax with plan for transition to Unasyn>>may dc home with Augmentin 875/125 bid x 5d with outpatient follow up in pulmonary clinic at [facility].    - noted RN documentation of narcosis with norcan treatment - have obtained urine drug screen -  patient may aspirated to RUL although this is less likely statistically due to location.     - patient significantly improved and may be discharged home with outpatient follow up.      - have discussed potential need for bronchocopy in future patient is agreeable and thankful.      Thank you for allowing me to participate in the care of this patient.   Patient/Family are satisfied with care plan and all questions have been answered.  This document was prepared using Dragon voice recognition software and may include unintentional dictation errors.     Ottie Glazier, M.D.  Division of [place]

## 2020-01-07 NOTE — Discharge Summary (Signed)
Physician Discharge Summary  Odel Schmid OEV:[id number] DOB: 09/16/1995 DOA: 01/03/2020  PCP: Patient, No Pcp Per  Admit date: 01/03/2020 Discharge date: 01/07/2020  Admitted From: home  Disposition: home   Recommendations for Outpatient Follow-up:  1. Follow up with PCP in 1-2 weeks 2. F/u w/ pulmon, Dr. Karna Christmas in 2 weeks  Home Health: no  Equipment/Devices:   Discharge Condition: stable  CODE STATUS: full  Diet recommendation: Regular  Brief/Interim Summary: HPI was taken from Dr. Joylene Igo:  Nathaniel Cooley is a 24 y.o. male with no significant past medical history who presents to the ER for evaluation of severe left lateral chest wall pain and a refractory nonproductive cough. Patient states that he had Covid in July and had recovered completely from that but about 2 weeks ago he developed a cough which is nonproductive, severe and has not responded to antitussives. He has had multiple urgent care visits in the past 10 days for evaluation of this cough and was recently placed on doxycycline and prednisone. He had a chest x-ray which showed a right upper lobe density and a follow-up CT scan which showed a cavitary lesion in his right upper lobe. He presented to the emergency room today with his mother due to severe left lateral chest wall pain. He denies having any fever, no chills, no shortness of breath, no weight loss, he denies having any night sweats or any contact with someone who has a chronic cough. He denies having any chest pain, no nausea, no vomiting, no abdominal pain or any changes in his bowel habits. Labs show sodium 140, potassium 3.6, chloride 107, bicarb 23, glucose 84, BUN 19, creatinine 0.90, calcium 8.9, albumin 3.9, AST 17, ALT 15, total protein 6.7, total bilirubin 0.7, white count 5.1, hemoglobin 13.8, hematocrit 41.9, MCV 87.8, RDW 12.0, platelet count 236 Respiratory viral panel is pending Chest x-ray shows vague right suprahilar opacity and subtle  groundglass opacities within the periphery of the right upper lobe CT scan of the chest shows 16 x 13 mm cavitating abnormality is noted posteriorly in the right upper lobe most consistent with cavitating pneumonia. At least 2 other densities are noted in the right upper lobe, with 1 measuring 12 x 7 mm just above the right minor fissure. These findings most likely represent multifocal inflammation or pneumonia. Follow-up CT scan in 3-4 weeks is recommended to ensure resolution and rule out underlying malignancy. Twelve-lead EKG reviewed by me shows normal sinus rhythm    ED Course: Patient is a 24 year old Caucasian male who presents to the ER accompanied by his mother for evaluation of left lateral chest wall pain as well as a refractory nonproductive cough. Patient has had multiple visits to the an urgent care center for same and was treated with doxycycline and prednisone. He had a CT scan of the chest which shows cavitating pneumonia in the right upper lobe. Discussed with pulmonologist, patient will be placed on isolation until TB is ruled out. For possible bronchoscopy in a.m. patient will be admitted to the hospital for further evaluation.  Hospital Course from Dr. Wilfred Lacy 01/04/20: Pt was found to have a cavitary pneumonia. On admission, it was stated that the pt needed to r/o for TB. Unfortunately 3 AFB sputum tests were unable to be done secondary to pt unable to produce it. Pulmon, ID & hospitalist did believe the pt had TB. Pt received IV rocephin, ceftriaxone while inpatient and was d/c w/ po augmentin. Pt will f/u outpatient w/ pulmon, Dr. Karna Christmas,  in 2 weeks. Pt verbalized his understanding.   Discharge Diagnoses:  Active Problems:   Cavitary pneumonia   Cavitary pneumonia: etiology unclear, likely CAP. Improving. Checking sputum for AFB to r/o TB but TB is unlikely. Not immunosuppressed and no known TB contacts. Continue w/ airborne precautions. Continue on IV rocephin,  ceftriaxone as per ID. Possibly d/c on augmentin tomorrow if ok w/ ID. Fungitell, legionella, mycoplasma are pending. Strep is neg. Encourage incentive spirometry   Bradycardia: asymptomatic. Not on CCB or BB. Will continue to monitor   Discharge Instructions  Discharge Instructions    Diet general   Complete by: As directed    Discharge instructions   Complete by: As directed    F/u pulmon, Dr. Karna Christmas, in 2 weeks. F/u PCP in 1-2 weeks   Increase activity slowly   Complete by: As directed      Allergies as of 01/07/2020      Reactions   Bee Venom Swelling, Anaphylaxis   Neosporin [neomycin-bacitracin Zn-polymyx] Rash      Medication List    STOP taking these medications   doxycycline 100 MG capsule Commonly known as: VIBRAMYCIN   predniSONE 10 MG (21) Tbpk tablet Commonly known as: STERAPRED UNI-PAK 21 TAB     TAKE these medications   amoxicillin-clavulanate 875-125 MG tablet Commonly known as: Augmentin Take 1 tablet by mouth 2 (two) times daily for 14 days.   oxyCODONE-acetaminophen 5-325 MG tablet Commonly known as: PERCOCET/ROXICET Take 1 tablet by mouth every 6 (six) hours as needed for up to 3 days for moderate pain or severe pain.       Follow-up Information    [place] Rigger, MD. Go on 01/18/2020.   Specialty: Pulmonary Disease Why: 10am appointment Contact information: [address] [place] [address] [phone]              Allergies  Allergen Reactions  . Bee Venom Swelling and Anaphylaxis  . Neosporin [Neomycin-Bacitracin Zn-Polymyx] Rash    Consultations:  ID  Pulmon    Procedures/Studies: DG Chest 2 View  Result Date: 01/03/2020 CLINICAL DATA:  Cough, pneumonia EXAM: CHEST - 2 VIEW COMPARISON:  01/02/2020 FINDINGS: The heart size and mediastinal contours are within normal limits. Vague right suprahilar opacity and subtle ground-glass opacities within the periphery of the right upper lobe, better seen on  previous CT. No new airspace consolidations. No pleural effusion or pneumothorax. The visualized skeletal structures are unremarkable. IMPRESSION: Vague right suprahilar opacity and subtle ground-glass opacities within the periphery of the right upper lobe, better seen on previous CT. No appreciable interval change. Electronically Signed   By: Duanne Guess D.O.   On: 01/03/2020 13:13   DG Chest 2 View  Result Date: 12/30/2019 CLINICAL DATA:  Pt reports worsening cough and chills x 2 weeks with no OTC cough medicine relief . Previous smoker. cough EXAM: CHEST - 2 VIEW COMPARISON:  None. FINDINGS: Is RIGHT suprahilar density measuring 2.5 cm. No pleural fluid. No peripheral airspace disease. No pneumothorax. No acute osseous abnormality. IMPRESSION: RIGHT suprahilar density. Differential would include focal airspace disease, unusual vascular pattern, or less likely pulmonary parenchymal nodule. Recommend clinical correlation and consider CT of the thorax. At minimum recommend follow-up chest radiograph. Electronically Signed   By: Genevive Bi M.D.   On: 12/30/2019 13:16   CT CHEST W CONTRAST  Result Date: 01/02/2020 CLINICAL DATA:  Cough, lung nodule. EXAM: CT CHEST WITH CONTRAST TECHNIQUE: Multidetector CT imaging of the chest was performed during intravenous  contrast administration. CONTRAST:  45mL OMNIPAQUE IOHEXOL 300 MG/ML  SOLN COMPARISON:  December 30, 2019. FINDINGS: Cardiovascular: No significant vascular findings. Normal heart size. No pericardial effusion. Mediastinum/Nodes: No enlarged mediastinal, hilar, or axillary lymph nodes. Thyroid gland, trachea, and esophagus demonstrate no significant findings. Lungs/Pleura: No pneumothorax or pleural effusion is noted. Left lung is clear. 16 x 13 mm cavitating abnormality is noted posteriorly in the right upper lobe most consistent with cavitating pneumonia. At least 2 other densities are noted in the right upper lobe, with 1 measuring 12 x 7  mm just above the right minor fissure. These findings most likely represent multifocal inflammation or pneumonia. Upper Abdomen: No acute abnormality. Musculoskeletal: No chest wall abnormality. No acute or significant osseous findings. IMPRESSION: 16 x 13 mm cavitating abnormality is noted posteriorly in the right upper lobe most consistent with cavitating pneumonia. At least 2 other densities are noted in the right upper lobe, with 1 measuring 12 x 7 mm just above the right minor fissure. These findings most likely represent multifocal inflammation or pneumonia. Follow-up CT scan in 3-4 weeks is recommended to ensure resolution and rule out underlying malignancy. Electronically Signed   By: Lupita Raider M.D.   On: 01/02/2020 12:44   DG Chest Port 1 View  Result Date: 01/06/2020 CLINICAL DATA:  Follow-up pneumonia. EXAM: PORTABLE CHEST 1 VIEW COMPARISON:  01/03/2020 FINDINGS: The heart size and mediastinal contours are within normal limits. Both lungs are clear. The visualized skeletal structures are unremarkable. IMPRESSION: No active disease. Electronically Signed   By: Signa Kell M.D.   On: 01/06/2020 11:43     Subjective: Pt c/o fatigue    Discharge Exam: Vitals:   01/07/20 0734 01/07/20 1225  BP: 118/68 119/69  Pulse: (!) 48 (!) 58  Resp: 19 18  Temp: 98 F (36.7 C) 97.9 F (36.6 C)  SpO2: 99% 99%   Vitals:   01/06/20 1900 01/07/20 0541 01/07/20 0734 01/07/20 1225  BP: 129/82 117/73 118/68 119/69  Pulse: (!) 57 (!) 58 (!) 48 (!) 58  Resp: 18 18 19 18   Temp: 98.1 F (36.7 C) 97.6 F (36.4 C) 98 F (36.7 C) 97.9 F (36.6 C)  TempSrc: Oral Oral Oral Oral  SpO2: 100% 100% 99% 99%  Weight:      Height:        General: Pt is alert, awake, not in acute distress Cardiovascular:  S1/S2 +, no rubs, no gallops Respiratory: diminished breath sounds b/l otherwise clear  Abdominal: Soft, NT, ND, bowel sounds + Extremities: no edema, no cyanosis    The results of  significant diagnostics from this hospitalization (including imaging, microbiology, ancillary and laboratory) are listed below for reference.     Microbiology: Recent Results (from the past 240 hour(s))  Blood culture (routine x 2)     Status: None (Preliminary result)   Collection Time: 01/03/20  1:40 PM   Specimen: BLOOD  Result Value Ref Range Status   Specimen Description BLOOD LEFT HAND  Final   Special Requests   Final    BOTTLES DRAWN AEROBIC AND ANAEROBIC Blood Culture results may not be optimal due to an inadequate volume of blood received in culture bottles   Culture   Final    NO GROWTH 4 DAYS Performed at [facility], [address] [place], [place]    Report Status PENDING  Incomplete  Blood culture (routine x 2)     Status: None (Preliminary result)   Collection  Time: 01/03/20  1:40 PM   Specimen: BLOOD  Result Value Ref Range Status   Specimen Description BLOOD LEFT FORE ARM  Final   Special Requests   Final    BOTTLES DRAWN AEROBIC AND ANAEROBIC Blood Culture adequate volume   Culture   Final    NO GROWTH 4 DAYS Performed at [facility], [address], [place], [place] [address]    Report Status PENDING  Incomplete  Resp Panel by RT-PCR (Flu A&B, Covid) Nasopharyngeal Swab     Status: None   Collection Time: 01/03/20  1:40 PM   Specimen: Nasopharyngeal Swab; Nasopharyngeal(NP) swabs in vial transport medium  Result Value Ref Range Status   SARS Coronavirus 2 by RT PCR NEGATIVE NEGATIVE Final    Comment: (NOTE) SARS-CoV-2 target nucleic acids are NOT DETECTED.  The SARS-CoV-2 RNA is generally detectable in upper respiratory specimens during the acute phase of infection. The lowest concentration of SARS-CoV-2 viral copies this assay can detect is 138 copies/mL. A negative result does not preclude SARS-Cov-2 infection and should not be used as the sole basis for treatment or other patient management decisions. A negative result  may occur with  improper specimen collection/handling, submission of specimen other than nasopharyngeal swab, presence of viral mutation(s) within the areas targeted by this assay, and inadequate number of viral copies(<138 copies/mL). A negative result must be combined with clinical observations, patient history, and epidemiological information. The expected result is Negative.  Fact Sheet for Patients:  BloggerCourse.com  Fact Sheet for Healthcare Providers:  SeriousBroker.it  This test is no t yet approved or cleared by the [place] FDA and  has been authorized for detection and/or diagnosis of SARS-CoV-2 by FDA under an Emergency Use Authorization (EUA). This EUA will remain  in effect (meaning this test can be used) for the duration of the COVID-19 declaration under Section 564(b)(1) of the Act, 21 U.S.C.section 360bbb-3(b)(1), unless the authorization is terminated  or revoked sooner.       Influenza A by PCR NEGATIVE NEGATIVE Final   Influenza B by PCR NEGATIVE NEGATIVE Final    Comment: (NOTE) The Xpert Xpress SARS-CoV-2/FLU/RSV plus assay is intended as an aid in the diagnosis of influenza from Nasopharyngeal swab specimens and should not be used as a sole basis for treatment. Nasal washings and aspirates are unacceptable for Xpert Xpress SARS-CoV-2/FLU/RSV testing.  Fact Sheet for Patients: BloggerCourse.com  Fact Sheet for Healthcare Providers: SeriousBroker.it  This test is not yet approved or cleared by the [place] FDA and has been authorized for detection and/or diagnosis of SARS-CoV-2 by FDA under an Emergency Use Authorization (EUA). This EUA will remain in effect (meaning this test can be used) for the duration of the COVID-19 declaration under Section 564(b)(1) of the Act, 21 U.S.C. section 360bbb-3(b)(1), unless the authorization is terminated  or revoked.  Performed at [facility], [address] [place], [place] [address]   Acid Fast Smear (AFB)     Status: None   Collection Time: 01/04/20 11:11 AM   Specimen: Sputum  Result Value Ref Range Status   AFB Specimen Processing Concentration  Final   Acid Fast Smear Negative  Final    Comment: (NOTE) Performed At: [facility] [address] [place], [place] [id number] Jolene Schimke MD OZ:[id number]    Source (AFB) SPUTUM  Final    Comment: Performed at [facility], [address] [place], [place] [address]  Expectorated sputum assessment w rflx to resp cult  Status: None   Collection Time: 01/05/20 12:00 PM   Specimen: Sputum  Result Value Ref Range Status   Specimen Description SPUTUM  Final   Special Requests NONE  Final   Sputum evaluation   Final    Sputum specimen not acceptable for testing.  Please recollect.   [facility] ANN MITCHELL 01/05/20 AT 1234 BY ACR Performed at [facility], [address] [place], [place] [address]    Report Status 01/05/2020 FINAL  Final     Labs: BNP (last 3 results) No results for input(s): BNP in the last 8760 hours. Basic Metabolic Panel: Recent Labs  Lab 01/03/20 1238 01/04/20 0455 01/05/20 0433 01/06/20 0324 01/07/20 0324  NA 140 140 138 141 139  K 3.6 3.6 3.5 3.7 3.9  CL 107 109 106 106 103  CO2 23 24 24 26 26   GLUCOSE 84 93 84 108* 96  BUN 19 16 12 11 12   CREATININE 0.90 0.74 0.73 0.80 0.82  CALCIUM 8.9 8.4* 7.9* 8.8* 9.0   Liver Function Tests: Recent Labs  Lab 01/03/20 1238  AST 17  ALT 15  ALKPHOS 65  BILITOT 0.7  PROT 6.7  ALBUMIN 3.9   No results for input(s): LIPASE, AMYLASE in the last 168 hours. No results for input(s): AMMONIA in the last 168 hours. CBC: Recent Labs  Lab 01/03/20 1238 01/04/20 0455 01/05/20 0433 01/06/20 0324 01/07/20 0324  WBC 5.1 9.5 6.5 6.4 7.0  HGB 13.8 12.6* 12.8* 13.7 14.7  HCT 41.9 38.3* 39.6 40.9 43.9  MCV 87.8 86.8  87.8 86.8 86.1  PLT 236 243 256 247 267   Cardiac Enzymes: No results for input(s): CKTOTAL, CKMB, CKMBINDEX, TROPONINI in the last 168 hours. BNP: Invalid input(s): POCBNP CBG: Recent Labs  Lab 01/04/20 1725  GLUCAP 79   D-Dimer No results for input(s): DDIMER in the last 72 hours. Hgb A1c No results for input(s): HGBA1C in the last 72 hours. Lipid Profile No results for input(s): CHOL, HDL, LDLCALC, TRIG, CHOLHDL, LDLDIRECT in the last 72 hours. Thyroid function studies No results for input(s): TSH, T4TOTAL, T3FREE, THYROIDAB in the last 72 hours.  Invalid input(s): FREET3 Anemia work up Recent Labs    01/05/20 1055  FERRITIN 26   Urinalysis No results found for: COLORURINE, APPEARANCEUR, LABSPEC, PHURINE, GLUCOSEU, HGBUR, BILIRUBINUR, KETONESUR, PROTEINUR, UROBILINOGEN, NITRITE, LEUKOCYTESUR Sepsis Labs Invalid input(s): PROCALCITONIN,  WBC,  LACTICIDVEN Microbiology Recent Results (from the past 240 hour(s))  Blood culture (routine x 2)     Status: None (Preliminary result)   Collection Time: 01/03/20  1:40 PM   Specimen: BLOOD  Result Value Ref Range Status   Specimen Description BLOOD LEFT HAND  Final   Special Requests   Final    BOTTLES DRAWN AEROBIC AND ANAEROBIC Blood Culture results may not be optimal due to an inadequate volume of blood received in culture bottles   Culture   Final    NO GROWTH 4 DAYS Performed at [facility], [address] [place], [address] [place]    Report Status PENDING  Incomplete  Blood culture (routine x 2)     Status: None (Preliminary result)   Collection Time: 01/03/20  1:40 PM   Specimen: BLOOD  Result Value Ref Range Status   Specimen Description BLOOD LEFT FORE ARM  Final   Special Requests   Final    BOTTLES DRAWN AEROBIC AND ANAEROBIC Blood Culture adequate volume   Culture   Final    NO GROWTH 4  DAYS Performed at [facility], [address] [place], [place] [address]    Report Status  PENDING  Incomplete  Resp Panel by RT-PCR (Flu A&B, Covid) Nasopharyngeal Swab     Status: None   Collection Time: 01/03/20  1:40 PM   Specimen: Nasopharyngeal Swab; Nasopharyngeal(NP) swabs in vial transport medium  Result Value Ref Range Status   SARS Coronavirus 2 by RT PCR NEGATIVE NEGATIVE Final    Comment: (NOTE) SARS-CoV-2 target nucleic acids are NOT DETECTED.  The SARS-CoV-2 RNA is generally detectable in upper respiratory specimens during the acute phase of infection. The lowest concentration of SARS-CoV-2 viral copies this assay can detect is 138 copies/mL. A negative result does not preclude SARS-Cov-2 infection and should not be used as the sole basis for treatment or other patient management decisions. A negative result may occur with  improper specimen collection/handling, submission of specimen other than nasopharyngeal swab, presence of viral mutation(s) within the areas targeted by this assay, and inadequate number of viral copies(<138 copies/mL). A negative result must be combined with clinical observations, patient history, and epidemiological information. The expected result is Negative.  Fact Sheet for Patients:  BloggerCourse.comhttps://www.fda.gov/media/152166/download  Fact Sheet for Healthcare Providers:  SeriousBroker.ithttps://www.fda.gov/media/152162/download  This test is no t yet approved or cleared by the [place] FDA and  has been authorized for detection and/or diagnosis of SARS-CoV-2 by FDA under an Emergency Use Authorization (EUA). This EUA will remain  in effect (meaning this test can be used) for the duration of the COVID-19 declaration under Section 564(b)(1) of the Act, 21 U.S.C.section 360bbb-3(b)(1), unless the authorization is terminated  or revoked sooner.       Influenza A by PCR NEGATIVE NEGATIVE Final   Influenza B by PCR NEGATIVE NEGATIVE Final    Comment: (NOTE) The Xpert Xpress SARS-CoV-2/FLU/RSV plus assay is intended as an aid in the diagnosis of  influenza from Nasopharyngeal swab specimens and should not be used as a sole basis for treatment. Nasal washings and aspirates are unacceptable for Xpert Xpress SARS-CoV-2/FLU/RSV testing.  Fact Sheet for Patients: BloggerCourse.comhttps://www.fda.gov/media/152166/download  Fact Sheet for Healthcare Providers: SeriousBroker.ithttps://www.fda.gov/media/152162/download  This test is not yet approved or cleared by the [place] FDA and has been authorized for detection and/or diagnosis of SARS-CoV-2 by FDA under an Emergency Use Authorization (EUA). This EUA will remain in effect (meaning this test can be used) for the duration of the COVID-19 declaration under Section 564(b)(1) of the Act, 21 U.S.C. section 360bbb-3(b)(1), unless the authorization is terminated or revoked.  Performed at [facility], [address] [place], [place] [address]   Acid Fast Smear (AFB)     Status: None   Collection Time: 01/04/20 11:11 AM   Specimen: Sputum  Result Value Ref Range Status   AFB Specimen Processing Concentration  Final   Acid Fast Smear Negative  Final    Comment: (NOTE) Performed At: [facility] [address] [place], [place] [id number] Jolene SchimkeNagendra Sanjai MD WG:[id number]    Source (AFB) SPUTUM  Final    Comment: Performed at [facility], [address] [place], [place] [address]  Expectorated sputum assessment w rflx to resp cult     Status: None   Collection Time: 01/05/20 12:00 PM   Specimen: Sputum  Result Value Ref Range Status   Specimen Description SPUTUM  Final   Special Requests NONE  Final   Sputum evaluation   Final    Sputum specimen not acceptable for testing.  Please recollect.  [facility] ANN MITCHELL 01/05/20 AT 1234 BY ACR Performed at [facility], [address] [place] [place], [place] [address]    Report Status 01/05/2020 FINAL  Final     Time coordinating discharge: Over 30 minutes  SIGNED:   Charise Killian, MD  [facility] 01/07/2020,  2:51 PM Pager   If 7PM-7AM, please contact night-coverage

## 2020-01-08 LAB — CULTURE, BLOOD (ROUTINE X 2)
Culture: NO GROWTH
Culture: NO GROWTH

## 2020-01-08 LAB — MISC LABCORP TEST (SEND OUT): Labcorp test code: 164284

## 2020-01-09 LAB — FUNGITELL, SERUM: Fungitell Result: <31 pg/mL (ref <80)

## 2020-02-29 LAB — ACID FAST CULTURE WITH REFLEXED SENSITIVITIES (MYCOBACTERIA): Acid Fast Culture: NEGATIVE

## 2020-05-16 ENCOUNTER — Other Ambulatory Visit: Payer: Self-pay

## 2020-05-16 ENCOUNTER — Ambulatory Visit
Admission: EM | Admit: 2020-05-16 | Discharge: 2020-05-16 | Disposition: A | Discharge status: Home / Self Care | Payer: PRIVATE HEALTH INSURANCE | Attending: Emergency Medicine | Admitting: Emergency Medicine

## 2020-05-16 DIAGNOSIS — Z20822 Contact with and (suspected) exposure to covid-19: Secondary | ICD-10-CM | POA: Diagnosis not present

## 2020-05-16 DIAGNOSIS — Z0189 Encounter for other specified special examinations: Secondary | ICD-10-CM | POA: Diagnosis not present

## 2020-05-16 NOTE — ED Triage Notes (Signed)
Pt requests COVID testing for travel. Pt denies any current COVID symptoms or exposure.

## 2020-05-16 NOTE — Discharge Instructions (Signed)

## 2020-05-17 LAB — SARS CORONAVIRUS 2 (TAT 6-24 HRS): SARS Coronavirus 2: NEGATIVE

## 2021-04-04 ENCOUNTER — Other Ambulatory Visit: Payer: Self-pay

## 2021-04-04 ENCOUNTER — Ambulatory Visit
Admission: EM | Admit: 2021-04-04 | Discharge: 2021-04-04 | Disposition: A | Discharge status: Home / Self Care | Payer: 59 | Attending: Emergency Medicine | Admitting: Emergency Medicine

## 2021-04-04 DIAGNOSIS — R519 Headache, unspecified: Secondary | ICD-10-CM | POA: Diagnosis not present

## 2021-04-04 DIAGNOSIS — S62109A Fracture of unspecified carpal bone, unspecified wrist, initial encounter for closed fracture: Secondary | ICD-10-CM

## 2021-04-04 HISTORY — DX: Fracture of unspecified carpal bone, unspecified wrist, initial encounter for closed fracture: S62.109A

## 2021-04-04 MED ORDER — KETOROLAC TROMETHAMINE 10 MG PO TABS: 10.0000 mg | ORAL_TABLET | Freq: Four times a day (QID) | ORAL | 0 refills | Status: DC | PRN

## 2021-04-04 MED ORDER — KETOROLAC TROMETHAMINE 60 MG/2ML IM SOLN
60.0000 mg | Freq: Once | INTRAMUSCULAR | Status: AC
  Administered 2021-04-04: 60 mg via INTRAMUSCULAR

## 2021-04-04 NOTE — Discharge Instructions (Addendum)
Increase your oral fluid intake to at least a gallon daily.  Avoid caffeine or sugary beverages.  Take the toradol every 6 hours as needed for headache pain. Take it with food.  Rest in a cool dark environment and avoid electronics as much as possible as they can make your headache worse.  Follow-up with your PCP for continued or worsening symptoms.

## 2021-04-04 NOTE — ED Triage Notes (Signed)
Patient is here for "bad ha". Started yesterday. No nausea. No vomiting. No visual changes with this episode.

## 2021-04-04 NOTE — ED Provider Notes (Signed)
[facility]    CSN: [id number] Arrival date & time: 04/04/21  1023      History   Chief Complaint Chief Complaint  Patient presents with   Headache    HPI Cobi Tafel is a 26 y.o. male.   HPI  26 year old male here for evaluation of headache.  Patient reports that his headache is biparietal and started yesterday.  This is not in the setting of any trauma patient denies any history of migraines, change in vision, nausea, vomiting, flashing lights or wavy lines, and he denies any URI symptoms.  He states that he is not taking any medication for his headache and states that it did affect his ability to sleep last night.  When inquiring about caffeine consumption he did not admit to any caffeine usage but he does smoke and drink alcohol.  He also states that he drinks approximately 1 to 2 gallons of water a day.  There is a family history of migraines as well.  Past Medical History:  Diagnosis Date   ADHD    Wrist fracture    Left.    Patient Active Problem List   Diagnosis Date Noted   Wrist fracture 04/04/2021   Cavitary pneumonia 01/03/2020    History reviewed. No pertinent surgical history.     Home Medications    Prior to Admission medications   Medication Sig Start Date End Date Taking? Authorizing Provider  ketorolac (TORADOL) 10 MG tablet Take 1 tablet (10 mg total) by mouth every 6 (six) hours as needed. 04/04/21  Yes Margarette Canada, NP    Family History Family History  Problem Relation Age of Onset   Healthy Mother    Diabetes Father     Social History Social History   Tobacco Use   Smoking status: Former   Smokeless tobacco: Never  Scientific laboratory technician Use: Never used  Substance Use Topics   Alcohol use: Yes    Comment: Occ.   Drug use: Yes    Types: Marijuana     Allergies   Bee venom, Bacitracin-polymyxin b, Benzalkonium chloride, Bacitracin-neomycin-polymyxin, and Neosporin [neomycin-bacitracin zn-polymyx]   Review of  Systems Review of Systems  Constitutional:  Negative for fever.  Eyes:  Negative for visual disturbance.  Gastrointestinal:  Negative for nausea and vomiting.  Neurological:  Positive for headaches. Negative for dizziness, syncope, weakness and numbness.  Hematological: Negative.   Psychiatric/Behavioral: Negative.      Physical Exam Triage Vital Signs ED Triage Vitals  Enc Vitals Group     BP 04/04/21 1048 122/69     Pulse Rate 04/04/21 1048 60     Resp 04/04/21 1048 18     Temp 04/04/21 1048 97.9 F (36.6 C)     Temp Source 04/04/21 1048 Oral     SpO2 04/04/21 1048 100 %     Weight 04/04/21 1043 158 lb (71.7 kg)     Height 04/04/21 1043 5\' 11"  (1.803 m)     Head Circumference --      Peak Flow --      Pain Score 04/04/21 1043 8     Pain Loc --      Pain Edu? --      Excl. in [place]? --    No data found.  Updated Vital Signs BP 122/69 (BP Location: Left Arm)    Pulse 60    Temp 97.9 F (36.6 C) (Oral)    Resp 18    Ht 5\' 11"  (1.803  m)    Wt 158 lb (71.7 kg)    SpO2 100%    BMI 22.04 kg/m   Visual Acuity Right Eye Distance:   Left Eye Distance:   Bilateral Distance:    Right Eye Near:   Left Eye Near:    Bilateral Near:     Physical Exam Vitals and nursing note reviewed.  Constitutional:      Appearance: Normal appearance. He is not ill-appearing.  HENT:     Head: Normocephalic and atraumatic.     Mouth/Throat:     Mouth: Mucous membranes are dry.     Pharynx: Oropharynx is clear. No posterior oropharyngeal erythema.  Eyes:     General: No scleral icterus.    Extraocular Movements: Extraocular movements intact.     Conjunctiva/sclera: Conjunctivae normal.     Pupils: Pupils are equal, round, and reactive to light.  Cardiovascular:     Rate and Rhythm: Normal rate and regular rhythm.     Pulses: Normal pulses.     Heart sounds: Normal heart sounds. No murmur heard.   No friction rub. No gallop.  Pulmonary:     Effort: Pulmonary effort is normal.      Breath sounds: Normal breath sounds. No wheezing, rhonchi or rales.  Skin:    General: Skin is warm and dry.     Capillary Refill: Capillary refill takes less than 2 seconds.     Findings: No erythema or rash.  Neurological:     General: No focal deficit present.     Mental Status: He is alert and oriented to person, place, and time.     Cranial Nerves: No cranial nerve deficit.     Sensory: No sensory deficit.     Motor: No weakness.     Coordination: Coordination normal.     Gait: Gait normal.     Deep Tendon Reflexes: Reflexes normal.  Psychiatric:        Mood and Affect: Mood normal.        Behavior: Behavior normal.        Thought Content: Thought content normal.        Judgment: Judgment normal.     UC Treatments / Results  Labs (all labs ordered are listed, but only abnormal results are displayed) Labs Reviewed - No data to display  EKG   Radiology No results found.  Procedures Procedures (including critical care time)  Medications Ordered in UC Medications  ketorolac (TORADOL) injection 60 mg (60 mg Intramuscular Given 04/04/21 1111)    Initial Impression / Assessment and Plan / UC Course  I have reviewed the triage vital signs and the nursing notes.  Pertinent labs & imaging results that were available during my care of the patient were reviewed by me and considered in my medical decision making (see chart for details).  Patient is a nontoxic-appearing 26 year old male here for evaluation of biparietal headache that started yesterday.  He has not taking any medication to try and resolve the headache.  He does not have any personal history of headaches or migraines.  His mother does have a history of migraines.  His physical exam reveals cranial nerves II through XII intact.  Bilateral grips and upper extremity strength are 5/5 in bilateral lower extremity strength is 5/5.  DTRs are 2+ globally.  Cardiopulmonary exam reveals S1 and S2 heart sounds with regular  rate and rhythm and lung sounds that are clear to auscultation all fields.  Patient's exam is consistent with a  common headache and I offered him a choice of ibuprofen or Toradol and he is requesting the injection.  I will give him a dose of Toradol IM prior to discharge and discharge him home with some Toradol tablets to continue.  I have encouraged him to continue to drink his gallon of water daily, avoid caffeinated beverages, avoid electronics and rest in a cool dark environment, and to return for reevaluation for new or worsening symptoms.  Work note provided.   Final Clinical Impressions(s) / UC Diagnoses   Final diagnoses:  Acute nonintractable headache, unspecified headache type     Discharge Instructions      Increase your oral fluid intake to at least a gallon daily.  Avoid caffeine or sugary beverages.  Take the toradol every 6 hours as needed for headache pain. Take it with food.  Rest in a cool dark environment and avoid electronics as much as possible as they can make your headache worse.  Follow-up with your PCP for continued or worsening symptoms.     ED Prescriptions     Medication Sig Dispense Auth. Provider   ketorolac (TORADOL) 10 MG tablet Take 1 tablet (10 mg total) by mouth every 6 (six) hours as needed. 20 tablet Margarette Canada, NP      PDMP not reviewed this encounter.   Margarette Canada, NP 04/04/21 1116

## 2021-04-05 ENCOUNTER — Ambulatory Visit: Payer: Self-pay

## 2021-11-08 IMAGING — DX DG CHEST 1V PORT
1 series · 1 of 1 positions shown · non-contrast
Comparison: 01/03/2020

CLINICAL DATA: Follow-up pneumonia.

EXAM:
PORTABLE CHEST 1 VIEW

[chest ap]
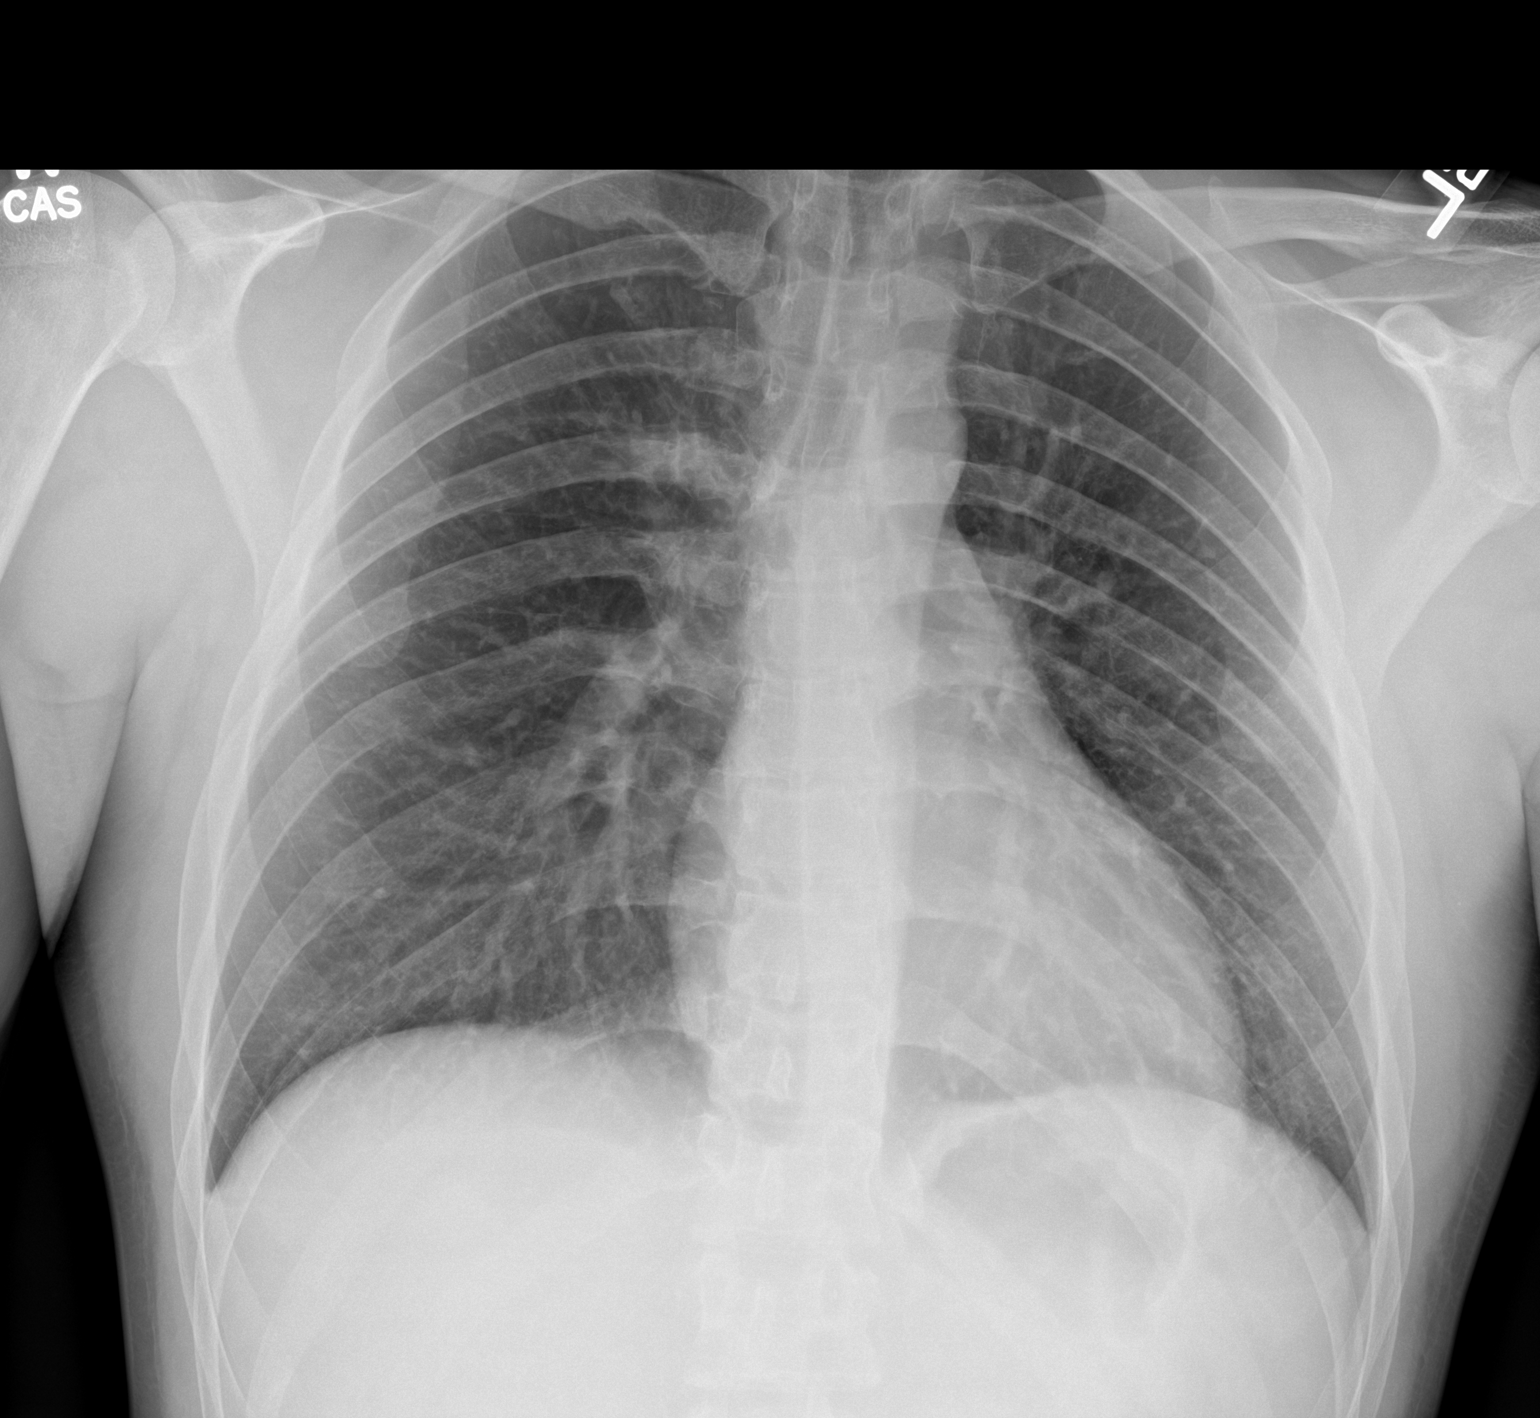

[1 of 1 positions shown; findings below may reference images not displayed]

FINDINGS: The heart size and mediastinal contours are within normal limits.
Both lungs are clear. The visualized skeletal structures are
unremarkable.
IMPRESSION: No active disease.

## 2021-12-24 DIAGNOSIS — R002 Palpitations: Secondary | ICD-10-CM | POA: Diagnosis not present

## 2021-12-24 DIAGNOSIS — G4709 Other insomnia: Secondary | ICD-10-CM | POA: Diagnosis not present

## 2021-12-24 DIAGNOSIS — R69 Illness, unspecified: Secondary | ICD-10-CM | POA: Diagnosis not present

## 2022-03-25 ENCOUNTER — Encounter (HOSPITAL_BASED_OUTPATIENT_CLINIC_OR_DEPARTMENT_OTHER): Payer: Self-pay | Admitting: Urology

## 2022-03-25 ENCOUNTER — Emergency Department (HOSPITAL_BASED_OUTPATIENT_CLINIC_OR_DEPARTMENT_OTHER)
Admission: EM | Admit: 2022-03-25 | Discharge: 2022-03-25 | Disposition: A | Discharge status: Home / Self Care | Payer: Commercial Managed Care - PPO | Attending: Emergency Medicine | Admitting: Emergency Medicine

## 2022-03-25 ENCOUNTER — Other Ambulatory Visit: Payer: Self-pay

## 2022-03-25 DIAGNOSIS — R197 Diarrhea, unspecified: Secondary | ICD-10-CM

## 2022-03-25 DIAGNOSIS — D72829 Elevated white blood cell count, unspecified: Secondary | ICD-10-CM | POA: Insufficient documentation

## 2022-03-25 DIAGNOSIS — Z1152 Encounter for screening for COVID-19: Secondary | ICD-10-CM | POA: Insufficient documentation

## 2022-03-25 DIAGNOSIS — K529 Noninfective gastroenteritis and colitis, unspecified: Secondary | ICD-10-CM | POA: Insufficient documentation

## 2022-03-25 LAB — URINALYSIS, ROUTINE W REFLEX MICROSCOPIC
Bilirubin Urine: NEGATIVE
Glucose, UA: NEGATIVE mg/dL
Hgb urine dipstick: NEGATIVE
Ketones, ur: NEGATIVE mg/dL
Leukocytes,Ua: NEGATIVE
Nitrite: NEGATIVE
Protein, ur: NEGATIVE mg/dL
Specific Gravity, Urine: 1.025 (ref 1.005–1.030)
pH: 5.5 (ref 5.0–8.0)

## 2022-03-25 LAB — COMPREHENSIVE METABOLIC PANEL
ALT: 15 U/L (ref 0–44)
AST: 17 U/L (ref 15–41)
Albumin: 4.1 g/dL (ref 3.5–5.0)
Alkaline Phosphatase: 59 U/L (ref 38–126)
Anion gap: 6 (ref 5–15)
BUN: 16 mg/dL (ref 6–20)
CO2: 26 mmol/L (ref 22–32)
Calcium: 8.8 mg/dL — ABNORMAL LOW (ref 8.9–10.3)
Chloride: 103 mmol/L (ref 98–111)
Creatinine, Ser: 0.88 mg/dL (ref 0.61–1.24)
GFR, Estimated: >60 mL/min (ref >60)
Glucose, Bld: 95 mg/dL (ref 70–99)
Potassium: 3.9 mmol/L (ref 3.5–5.1)
Sodium: 135 mmol/L (ref 135–145)
Total Bilirubin: 0.8 mg/dL (ref 0.3–1.2)
Total Protein: 7.4 g/dL (ref 6.5–8.1)

## 2022-03-25 LAB — CBC
HCT: 47.8 % (ref 39.0–52.0)
Hemoglobin: 16 g/dL (ref 13.0–17.0)
MCH: 28.4 pg (ref 26.0–34.0)
MCHC: 33.5 g/dL (ref 30.0–36.0)
MCV: 84.8 fL (ref 80.0–100.0)
Platelets: 174 10*3/uL (ref 150–400)
RBC: 5.64 MIL/uL (ref 4.22–5.81)
RDW: 12.3 % (ref 11.5–15.5)
WBC: 3.2 10*3/uL — ABNORMAL LOW (ref 4.0–10.5)
nRBC: 0 % (ref 0.0–0.2)

## 2022-03-25 LAB — RESP PANEL BY RT-PCR (RSV, FLU A&B, COVID)  RVPGX2
Influenza A by PCR: NEGATIVE
Influenza B by PCR: NEGATIVE
Resp Syncytial Virus by PCR: NEGATIVE
SARS Coronavirus 2 by RT PCR: NEGATIVE

## 2022-03-25 LAB — LIPASE, BLOOD: Lipase: 27 U/L (ref 11–51)

## 2022-03-25 MED ORDER — DICYCLOMINE HCL 20 MG PO TABS: 20.0000 mg | ORAL_TABLET | Freq: Two times a day (BID) | ORAL | 0 refills | Status: DC | PRN

## 2022-03-25 MED ORDER — LACTATED RINGERS IV BOLUS
1000.0000 mL | Freq: Once | INTRAVENOUS | Status: AC
  Administered 2022-03-25: 1000 mL via INTRAVENOUS

## 2022-03-25 MED ORDER — ONDANSETRON HCL 4 MG/2ML IJ SOLN: 4.0000 mg | Freq: Once | INTRAMUSCULAR | Status: DC | PRN

## 2022-03-25 MED ORDER — KETOROLAC TROMETHAMINE 30 MG/ML IJ SOLN
30.0000 mg | Freq: Once | INTRAMUSCULAR | Status: AC
  Administered 2022-03-25: 30 mg via INTRAVENOUS
  Filled 2022-03-25: qty 1

## 2022-03-25 MED ORDER — ONDANSETRON 4 MG PO TBDP: 4.0000 mg | ORAL_TABLET | Freq: Three times a day (TID) | ORAL | 0 refills | Status: DC | PRN

## 2022-03-25 MED ORDER — ONDANSETRON HCL 4 MG/2ML IJ SOLN
4.0000 mg | Freq: Once | INTRAMUSCULAR | Status: AC
  Administered 2022-03-25: 4 mg via INTRAVENOUS
  Filled 2022-03-25: qty 2

## 2022-03-25 NOTE — ED Provider Notes (Signed)
[facility] Provider Note   CSN: [id number] Arrival date & time: 03/25/22  1246     History  Chief Complaint  Patient presents with   Emesis    Nathaniel Cooley is a 27 y.o. male.  With no contributory past medical history who presents with 1 to 2 days of abdominal cramping, vomiting, diarrhea after eating a "bad shrimp poboy."  Patient and his partner recently returned from [place] for [employer].  While they were there they had eaten a Bact[employer] Po boy.  They both developed similar symptoms.  He has had abdominal cramping nonbloody nonbilious emesis and nonbloody diarrhea.  They had to stop multiple times on the drive home to go to the bathroom.  He has had no fevers or chills.  Notes a mild sore throat from the vomiting.  Has had some mild aches as well.  No history of abdominal surgeries.  No GU complaints.  No hematuria or dysuria.  He has not taken any medicines for the pain or symptoms.   Emesis      Home Medications Prior to Admission medications   Medication Sig Start Date End Date Taking? Authorizing Provider  dicyclomine (BENTYL) 20 MG tablet Take 1 tablet (20 mg total) by mouth 2 (two) times daily as needed (abdominal cramping). 03/25/22  Yes Elgie Congo, MD  ondansetron (ZOFRAN-ODT) 4 MG disintegrating tablet Take 1 tablet (4 mg total) by mouth every 8 (eight) hours as needed for nausea or vomiting. 03/25/22  Yes Elgie Congo, MD  ketorolac (TORADOL) 10 MG tablet Take 1 tablet (10 mg total) by mouth every 6 (six) hours as needed. 04/04/21   Margarette Canada, NP      Allergies    Bee venom, Bacitracin-polymyxin b, Benzalkonium chloride, Bacitracin-neomycin-polymyxin, and Neosporin [neomycin-bacitracin zn-polymyx]    Review of Systems   Review of Systems  Gastrointestinal:  Positive for vomiting.    Physical Exam Updated Vital Signs BP 115/70 (BP Location: Left Arm)   Pulse 64   Temp 97.9 F (36.6 C) (Oral)    Resp 16   Ht 5' 11"$  (1.803 m)   Wt 71.7 kg   SpO2 99%   BMI 22.05 kg/m  Physical Exam Constitutional: Alert and oriented. Slightly uncomfortable but nontoxic Eyes: Conjunctivae are normal. ENT      Head: Normocephalic and atraumatic.      Nose: No congestion.      Mouth/Throat: Mucous membranes are moist.      Neck: No stridor. Cardiovascular: S1, S2,  Normal and symmetric distal pulses are present in all extremities.Warm and well perfused. Respiratory: Normal respiratory effort. Breath sounds are normal. O2 sat 99 onRA Gastrointestinal: Soft and mild diffuse ttp, no localization, not peritonitic Musculoskeletal: Normal range of motion in all extremities. Neurologic: Normal speech and language. No gross focal neurologic deficits are appreciated. Skin: Skin is warm, dry and intact. No rash noted. Psychiatric: Mood and affect are normal. Speech and behavior are normal.  ED Results / Procedures / Treatments   Labs (all labs ordered are listed, but only abnormal results are displayed) Labs Reviewed  COMPREHENSIVE METABOLIC PANEL - Abnormal; Notable for the following components:      Result Value   Calcium 8.8 (*)    All other components within normal limits  CBC - Abnormal; Notable for the following components:   WBC 3.2 (*)    All other components within normal limits  RESP PANEL BY RT-PCR (RSV, FLU A&B,  COVID)  RVPGX2  LIPASE, BLOOD  URINALYSIS, ROUTINE W REFLEX MICROSCOPIC    EKG None  Radiology No results found.  Procedures Procedures    Medications Ordered in ED Medications  lactated ringers bolus 1,000 mL ( Intravenous Stopped 03/25/22 1450)  ketorolac (TORADOL) 30 MG/ML injection 30 mg (30 mg Intravenous Given 03/25/22 1330)  ondansetron (ZOFRAN) injection 4 mg (4 mg Intravenous Given 03/25/22 1330)    ED Course/ Medical Decision Making/ A&P Clinical Course as of 03/25/22 1746  Thu Mar 25, 2022  1358 Labs reviewed by me generally unremarkable, normal sodium  135, creatinine 0.88 within normal limits.  Mild lymphopenia white blood cell count 3.2.  Normal lipase 27, no concern for acute pancreatitis.  Normal AST/ALT and T bilirubin.   [VB]    Clinical Course User Index [VB] Elgie Congo, MD   {                            Medical Decision Making Nathaniel Cooley is a 27 y.o. male.  With no contributory past medical history who presents with 1 to 2 days of abdominal cramping, vomiting, diarrhea after eating a "bad shrimp poboy."    Based on patient's history and presentation, suspect likely viral gastroenteritis versus acute food poisoning versus possible pancreatitis.  He does have a very benign abdominal exam without localization, highly unlikely to be acute biliary pathology, diverticulitis or appendicitis.  Do not think he requires imaging at this time.  Labs reviewed by me generally unremarkable, normal sodium 135, creatinine 0.88 within normal limits.  Mild lymphopenia white blood cell count 3.2.  Normal lipase 27, no concern for acute pancreatitis.  Normal AST/ALT and T bilirubin.   UA without evidence of UTI or nephrolithiasis.  Patient reassessed after fluids, antiemetics and pain meds with improvement.  No further vomiting here.  Hemodynamically stable without signs of severe dehydration.  Safe to be discharged home with as needed medications and close follow-up with PCP and return precautions.  He is in agreement with plan and safe for discharge at this time.  Amount and/or Complexity of Data Reviewed Labs: ordered.  Risk Prescription drug management.    Final Clinical Impression(s) / ED Diagnoses Final diagnoses:  Nausea vomiting and diarrhea  Gastroenteritis    Rx / DC Orders ED Discharge Orders          Ordered    ondansetron (ZOFRAN-ODT) 4 MG disintegrating tablet  Every 8 hours PRN        03/25/22 1505    dicyclomine (BENTYL) 20 MG tablet  2 times daily PRN        03/25/22 1505              Elgie Congo, MD 03/25/22 1746

## 2022-03-25 NOTE — Discharge Instructions (Signed)
You have been seen in the Emergency Department (ED)  today for nausea and vomiting.  Your work up today has not shown a clear cause for your symptoms, but they may be due to a viral infection or food poisoning. You have been prescribed Zofran; please use as prescribed as needed for your nausea.  Follow up with your doctor as soon as possible, ideally within one week, regarding today's emergent visit and your symptoms of nausea/vomiting.   Return to the Emergency Department (ED)  if you develop severe abdominal pain, bloody vomiting, bloody diarrhea, if you are unable to tolerate fluids due to vomiting, or if you develop other symptoms that concern you.

## 2022-03-25 NOTE — ED Triage Notes (Signed)
Pt states N/V/D and back pain x 2 days since going to [place] for vacation Reports lower back pain and headache as well

## 2022-03-25 NOTE — ED Notes (Signed)
Discharge paperwork reviewed entirely with patient, including Rx's and follow up care. Pain was under control. Pt verbalized understanding as well as all parties involved. No questions or concerns voiced at the time of discharge. No acute distress noted.   Pt ambulated out to PVA without incident or assistance.  

## 2022-04-22 ENCOUNTER — Telehealth: Payer: Self-pay

## 2022-04-22 ENCOUNTER — Ambulatory Visit
Admission: EM | Admit: 2022-04-22 | Discharge: 2022-04-22 | Disposition: A | Discharge status: Home / Self Care | Payer: PRIVATE HEALTH INSURANCE | Attending: Emergency Medicine | Admitting: Emergency Medicine

## 2022-04-22 DIAGNOSIS — J069 Acute upper respiratory infection, unspecified: Secondary | ICD-10-CM | POA: Insufficient documentation

## 2022-04-22 DIAGNOSIS — Z1152 Encounter for screening for COVID-19: Secondary | ICD-10-CM | POA: Insufficient documentation

## 2022-04-22 LAB — RAPID INFLUENZA A&B ANTIGENS
Influenza A (ARMC): NEGATIVE
Influenza B (ARMC): NEGATIVE

## 2022-04-22 LAB — SARS CORONAVIRUS 2 BY RT PCR: SARS Coronavirus 2 by RT PCR: NEGATIVE

## 2022-04-22 MED ORDER — BENZONATATE 100 MG PO CAPS: 200.0000 mg | ORAL_CAPSULE | Freq: Three times a day (TID) | ORAL | 0 refills | Status: DC

## 2022-04-22 MED ORDER — IPRATROPIUM BROMIDE 0.06 % NA SOLN: 2.0000 | Freq: Four times a day (QID) | NASAL | 12 refills | Status: DC

## 2022-04-22 NOTE — ED Triage Notes (Signed)
Pt c/o cough & nasal congestion since this AM, denies any fevers,chills or bodyaches.

## 2022-04-22 NOTE — ED Provider Notes (Signed)
[facility]    CSN: [id number] Arrival date & time: 04/22/22  1001      History   Chief Complaint Chief Complaint  Patient presents with   Cough   Nasal Congestion    \    HPI Nathaniel Cooley is a 27 y.o. male.   HPI  27 year old male here for evaluation of respiratory symptoms.  Patient has a past medical history that is significant for ADHD, restructure, and cavitary pneumonia presenting for evaluation of nasal congestion and a cough that began this morning.  He denies any associated fever, runny nose, sore throat, ear pain, shortness breath, wheezing, or GI complaints.  His cough is nonproductive.  Past Medical History:  Diagnosis Date   ADHD    Wrist fracture    Left.    Patient Active Problem List   Diagnosis Date Noted   Wrist fracture 04/04/2021   Cavitary pneumonia 01/03/2020    History reviewed. No pertinent surgical history.     Home Medications    Prior to Admission medications   Medication Sig Start Date End Date Taking? Authorizing Provider  benzonatate (TESSALON) 100 MG capsule Take 2 capsules (200 mg total) by mouth every 8 (eight) hours. 04/22/22  Yes Margarette Canada, NP  ipratropium (ATROVENT) 0.06 % nasal spray Place 2 sprays into both nostrils 4 (four) times daily. 04/22/22  Yes Margarette Canada, NP    Family History Family History  Problem Relation Age of Onset   Healthy Mother    Diabetes Father     Social History Social History   Tobacco Use   Smoking status: Former   Smokeless tobacco: Never  Scientific laboratory technician Use: Never used  Substance Use Topics   Alcohol use: Yes    Comment: Occ.   Drug use: Yes    Types: Marijuana     Allergies   Bee venom, Bacitracin-polymyxin b, Benzalkonium chloride, Bacitracin-neomycin-polymyxin, and Neosporin [neomycin-bacitracin zn-polymyx]   Review of Systems Review of Systems  Constitutional:  Negative for fever.  HENT:  Positive for congestion. Negative for ear pain, rhinorrhea  and sore throat.   Respiratory:  Positive for cough. Negative for shortness of breath and wheezing.   Gastrointestinal:  Negative for diarrhea, nausea and vomiting.     Physical Exam Triage Vital Signs ED Triage Vitals  Enc Vitals Group     BP      Pulse      Resp      Temp      Temp src      SpO2      Weight      Height      Head Circumference      Peak Flow      Pain Score      Pain Loc      Pain Edu?      Excl. in [place]?    No data found.  Updated Vital Signs BP 130/85 (BP Location: Left Arm)   Pulse (!) 52   Temp (!) 97.5 F (36.4 C) (Oral)   Resp 16   Ht '5\' 11"'$  (1.803 m)   Wt 165 lb (74.8 kg)   SpO2 98%   BMI 23.01 kg/m   Visual Acuity Right Eye Distance:   Left Eye Distance:   Bilateral Distance:    Right Eye Near:   Left Eye Near:    Bilateral Near:     Physical Exam Vitals and nursing note reviewed.  Constitutional:  Appearance: Normal appearance. He is not ill-appearing.  HENT:     Head: Normocephalic and atraumatic.     Right Ear: Tympanic membrane, ear canal and external ear normal. There is no impacted cerumen.     Left Ear: Tympanic membrane, ear canal and external ear normal. There is no impacted cerumen.     Nose: Congestion present. No rhinorrhea.     Mouth/Throat:     Mouth: Mucous membranes are moist.     Pharynx: Oropharynx is clear. No oropharyngeal exudate or posterior oropharyngeal erythema.  Cardiovascular:     Rate and Rhythm: Normal rate and regular rhythm.     Pulses: Normal pulses.     Heart sounds: Normal heart sounds. No murmur heard.    No friction rub. No gallop.  Pulmonary:     Effort: Pulmonary effort is normal.     Breath sounds: Normal breath sounds. No wheezing, rhonchi or rales.  Musculoskeletal:     Cervical back: Normal range of motion.  Lymphadenopathy:     Cervical: No cervical adenopathy.  Skin:    General: Skin is warm and dry.     Capillary Refill: Capillary refill takes less than 2 seconds.   Neurological:     General: No focal deficit present.     Mental Status: He is alert.      UC Treatments / Results  Labs (all labs ordered are listed, but only abnormal results are displayed) Labs Reviewed  RAPID INFLUENZA A&B ANTIGENS  SARS CORONAVIRUS 2 BY RT PCR    EKG   Radiology No results found.  Procedures Procedures (including critical care time)  Medications Ordered in UC Medications - No data to display  Initial Impression / Assessment and Plan / UC Course  I have reviewed the triage vital signs and the nursing notes.  Pertinent labs & imaging results that were available during my care of the patient were reviewed by me and considered in my medical decision making (see chart for details).   Patient is a nontoxic-appearing 27 year old male presenting for evaluation of nasal congestion and a nonproductive cough that started this morning without any other associated upper respiratory, lower respiratory, or GI symptoms.  His father does have similar symptoms.  He states that he was advised to come over to urgent care by his boss at [employer] to be tested for COVID and flu given that he has been coughing.  His physical exam nasal congestion but no rhinorrhea or postnasal drip appreciated.  Lungs are clear to auscultation in all fields.  Insistent with an early upper respiratory infection.  I will order a COVID and influenza swab.  I have advised the patient that the recent guidelines have changed for COVID and there is no longer quarantine advised unless patient is febrile.  Influenza antigen test is negative.  COVID PCR is negative.  I will discharge patient home with a diagnosis of viral URI with cough and treated with Atrovent nasal spray and Tessalon Perles.  Work note provided.   Final Clinical Impressions(s) / UC Diagnoses   Final diagnoses:  Viral URI with cough     Discharge Instructions      Your testing today was negative for COVID or flu.  I do  believe you have a respiratory virus that lifelines course.  Is only you are not running a fever you are not contagious.  However, I do recommend to wear a mask around others for the next several days.  Use the Atrovent nasal spray,  2 squirts up each nostril every 6 hours to help with nasal congestion and drainage which is most likely feeding her cough.  You can also use the Tessalon Perles every 8 hours during the day to help calm down the cough reflex.  If you develop any new or worsening symptoms please return for reevaluation or see your primary care provider.     ED Prescriptions     Medication Sig Dispense Auth. Provider   benzonatate (TESSALON) 100 MG capsule Take 2 capsules (200 mg total) by mouth every 8 (eight) hours. 21 capsule Margarette Canada, NP   ipratropium (ATROVENT) 0.06 % nasal spray Place 2 sprays into both nostrils 4 (four) times daily. 15 mL Margarette Canada, NP      PDMP not reviewed this encounter.   Margarette Canada, NP 04/22/22 1139

## 2022-04-22 NOTE — Discharge Instructions (Signed)
Your testing today was negative for COVID or flu.  I do believe you have a respiratory virus that lifelines course.  Is only you are not running a fever you are not contagious.  However, I do recommend to wear a mask around others for the next several days.  Use the Atrovent nasal spray, 2 squirts up each nostril every 6 hours to help with nasal congestion and drainage which is most likely feeding her cough.  You can also use the Tessalon Perles every 8 hours during the day to help calm down the cough reflex.  If you develop any new or worsening symptoms please return for reevaluation or see your primary care provider.

## 2022-05-20 ENCOUNTER — Ambulatory Visit
Admission: RE | Admit: 2022-05-20 | Discharge: 2022-05-20 | Disposition: A | Discharge status: Home / Self Care | Payer: Self-pay | Source: Ambulatory Visit | Attending: Internal Medicine | Admitting: Internal Medicine

## 2022-05-20 VITALS — BP 126/79 | HR 76 | Temp 98.4°F | Resp 18

## 2022-05-20 DIAGNOSIS — B9689 Other specified bacterial agents as the cause of diseases classified elsewhere: Secondary | ICD-10-CM

## 2022-05-20 DIAGNOSIS — J019 Acute sinusitis, unspecified: Secondary | ICD-10-CM

## 2022-05-20 MED ORDER — GUAIFENESIN ER 600 MG PO TB12: 600.0000 mg | ORAL_TABLET | Freq: Two times a day (BID) | ORAL | 0 refills | Status: AC

## 2022-05-20 MED ORDER — FLUTICASONE PROPIONATE 50 MCG/ACT NA SUSP: 1.0000 | Freq: Every day | NASAL | 0 refills | Status: DC

## 2022-05-20 MED ORDER — AMOXICILLIN-POT CLAVULANATE 875-125 MG PO TABS: 1.0000 | ORAL_TABLET | Freq: Two times a day (BID) | ORAL | 0 refills | Status: DC

## 2022-05-20 NOTE — ED Provider Notes (Signed)
[facility]    CSN: [id number] Arrival date & time: 05/20/22  1310      History   Chief Complaint Chief Complaint  Patient presents with   Nasal Congestion   Sinus Pressure     HPI Nathaniel Cooley is a 27 y.o. male comes to urgent care with 2-week history of nasal congestion, postnasal drainage and coughing.  Patient's symptoms started insidiously about 2 weeks ago.  He attributed his nasal congestion symptoms to seasonal allergies.  He started taking over-the-counter allergy medication with no improvement in his symptoms.  Few days ago, patient noticed worsening nasal congestion with yellowish-brown nasal drainage.  He denies any shortness of breath or wheezing.  He has a cough which is nonproductive.  No chest pain or chest pressure.  No chest tightness.  Patient currently feels pressure over his sinuses.  He has a mild headache.  He denies any nausea/vomiting.  HPI  Past Medical History:  Diagnosis Date   ADHD    Wrist fracture    Left.    Patient Active Problem List   Diagnosis Date Noted   Wrist fracture 04/04/2021   Cavitary pneumonia 01/03/2020    History reviewed. No pertinent surgical history.     Home Medications    Prior to Admission medications   Medication Sig Start Date End Date Taking? Authorizing Provider  amoxicillin-clavulanate (AUGMENTIN) 875-125 MG tablet Take 1 tablet by mouth every 12 (twelve) hours. 05/20/22  Yes Ranita Stjulien, Britta Mccreedy, MD  fluticasone (FLONASE) 50 MCG/ACT nasal spray Place 1 spray into both nostrils daily. 05/20/22  Yes Fahim Kats, Britta Mccreedy, MD  guaiFENesin (MUCINEX) 600 MG 12 hr tablet Take 1 tablet (600 mg total) by mouth 2 (two) times daily for 10 days. 05/20/22 05/30/22 Yes Blyss Lugar, Britta Mccreedy, MD  guanFACINE (INTUNIV) 1 MG TB24 ER tablet Take 1 tablet by mouth at bedtime. 12/24/21  Yes [provider]  benzonatate (TESSALON) 100 MG capsule Take 2 capsules (200 mg total) by mouth every 8 (eight) hours. 04/22/22   Becky Augusta, NP  ipratropium (ATROVENT) 0.06 % nasal spray Place 2 sprays into both nostrils 4 (four) times daily. 04/22/22   Becky Augusta, NP    Family History Family History  Problem Relation Age of Onset   Healthy Mother    Diabetes Father     Social History Social History   Tobacco Use   Smoking status: Former   Smokeless tobacco: Never  Building services engineer Use: Never used  Substance Use Topics   Alcohol use: Yes    Comment: Occ.   Drug use: Yes    Types: Marijuana     Allergies   Bee venom, Bacitracin-polymyxin b, Benzalkonium chloride, Bacitracin-neomycin-polymyxin, and Neosporin [neomycin-bacitracin zn-polymyx]   Review of Systems Review of Systems As per HPI  Physical Exam Triage Vital Signs ED Triage Vitals  Enc Vitals Group     BP 05/20/22 1344 126/79     Pulse Rate 05/20/22 1344 76     Resp 05/20/22 1344 18     Temp 05/20/22 1344 98.4 F (36.9 C)     Temp Source 05/20/22 1344 Oral     SpO2 05/20/22 1344 96 %     Weight --      Height --      Head Circumference --      Peak Flow --      Pain Score 05/20/22 1342 0     Pain Loc --      Pain  Edu? --      Excl. in GC? --    No data found.  Updated Vital Signs BP 126/79 (BP Location: Right Arm)   Pulse 76   Temp 98.4 F (36.9 C) (Oral)   Resp 18   SpO2 96%   Visual Acuity Right Eye Distance:   Left Eye Distance:   Bilateral Distance:    Right Eye Near:   Left Eye Near:    Bilateral Near:     Physical Exam Vitals and nursing note reviewed.  Constitutional:      General: He is not in acute distress.    Appearance: He is not ill-appearing.  HENT:     Right Ear: Tympanic membrane normal.     Left Ear: Tympanic membrane normal.  Cardiovascular:     Rate and Rhythm: Normal rate and regular rhythm.     Pulses: Normal pulses.     Heart sounds: Normal heart sounds.  Pulmonary:     Effort: Pulmonary effort is normal.     Breath sounds: Normal breath sounds.  Abdominal:     General: Bowel  sounds are normal.     Palpations: Abdomen is soft.  Neurological:     Mental Status: He is alert.      UC Treatments / Results  Labs (all labs ordered are listed, but only abnormal results are displayed) Labs Reviewed - No data to display  EKG   Radiology No results found.  Procedures Procedures (including critical care time)  Medications Ordered in UC Medications - No data to display  Initial Impression / Assessment and Plan / UC Course  I have reviewed the triage vital signs and the nursing notes.  Pertinent labs & imaging results that were available during my care of the patient were reviewed by me and considered in my medical decision making (see chart for details).     1.  Acute bacterial sinusitis: Augmentin twice daily for 7 days Flonase daily Mucinex 600 mg twice daily Tylenol/Motrin as needed for pain/or fever Return precautions given. Final Clinical Impressions(s) / UC Diagnoses   Final diagnoses:  Acute bacterial sinusitis     Discharge Instructions      Please maintain adequate hydration Saline nasal rinse recommended Please use medications as directed If you have worsening symptoms please return to urgent care to be reevaluated.   ED Prescriptions     Medication Sig Dispense Auth. Provider   amoxicillin-clavulanate (AUGMENTIN) 875-125 MG tablet Take 1 tablet by mouth every 12 (twelve) hours. 14 tablet Ismael Karge, Britta Mccreedy, MD   fluticasone (FLONASE) 50 MCG/ACT nasal spray Place 1 spray into both nostrils daily. 16 g Merrilee Jansky, MD   guaiFENesin (MUCINEX) 600 MG 12 hr tablet Take 1 tablet (600 mg total) by mouth 2 (two) times daily for 10 days. 20 tablet Judas Mohammad, Britta Mccreedy, MD      PDMP not reviewed this encounter.   Merrilee Jansky, MD 05/20/22 [phone]

## 2022-05-20 NOTE — Discharge Instructions (Addendum)
Please maintain adequate hydration Saline nasal rinse recommended Please use medications as directed If you have worsening symptoms please return to urgent care to be reevaluated.

## 2022-05-20 NOTE — ED Triage Notes (Signed)
Pt presents with nasal congestion and sinus pressure x 2 weeks. Pt has tried OTC allergy medication for his symptoms.

## 2022-09-11 ENCOUNTER — Ambulatory Visit
Admission: EM | Admit: 2022-09-11 | Discharge: 2022-09-11 | Disposition: A | Discharge status: Home / Self Care | Payer: Self-pay | Attending: Internal Medicine | Admitting: Internal Medicine

## 2022-09-11 DIAGNOSIS — A09 Infectious gastroenteritis and colitis, unspecified: Secondary | ICD-10-CM | POA: Insufficient documentation

## 2022-09-11 NOTE — ED Triage Notes (Signed)
Pt states that he last had diarrhea this morning after eating a bagel. Pt states that his stool has chunks of food in it.

## 2022-09-11 NOTE — Discharge Instructions (Addendum)
You may take Pepto as directed in the box until we receive the results.  Avoid dairy til your stools is back to normal.

## 2022-09-11 NOTE — ED Provider Notes (Signed)
[facility]    CSN: [id number] Arrival date & time: 09/11/22  1227      History   Chief Complaint Chief Complaint  Patient presents with   Abdominal Pain    HPI Nathaniel Cooley is a 27 y.o. male  presents with his mother,  with onset of diarrhea this am after eating a bage. He was diagnosed with food poisoning 4 days ago while in [place] and has had diarrhea since every time he eats something. He was seen at a Clinic in [place] and prescribed bactrim and has only taken one. He was also prescribed Sulfa and Neomycin pills. He only took one sulfa dose, feels this was making his worse. Has had 3 BM's per day on average. Denies N/V Denies blood in stool.      Past Medical History:  Diagnosis Date   ADHD    Wrist fracture    Left.    Patient Active Problem List   Diagnosis Date Noted   Wrist fracture 04/04/2021   Cavitary pneumonia 01/03/2020    History reviewed. No pertinent surgical history.     Home Medications    Prior to Admission medications   Medication Sig Start Date End Date Taking? Authorizing Provider  amoxicillin-clavulanate (AUGMENTIN) 875-125 MG tablet Take 1 tablet by mouth every 12 (twelve) hours. 05/20/22  Yes Lamptey, Britta Mccreedy, MD  benzonatate (TESSALON) 100 MG capsule Take 2 capsules (200 mg total) by mouth every 8 (eight) hours. 04/22/22  Yes Becky Augusta, NP  fluticasone (FLONASE) 50 MCG/ACT nasal spray Place 1 spray into both nostrils daily. 05/20/22  Yes Lamptey, Britta Mccreedy, MD  guanFACINE (INTUNIV) 1 MG TB24 ER tablet Take 1 tablet by mouth at bedtime. 12/24/21  Yes [provider]  ipratropium (ATROVENT) 0.06 % nasal spray Place 2 sprays into both nostrils 4 (four) times daily. 04/22/22  Yes Becky Augusta, NP    Family History Family History  Problem Relation Age of Onset   Healthy Mother    Diabetes Father     Social History Social History   Tobacco Use   Smoking status: Former   Smokeless tobacco: Never  Vaping Use    Vaping status: Never Used  Substance Use Topics   Alcohol use: Yes    Comment: Occ.   Drug use: Yes    Types: Marijuana     Allergies   Bee venom, Bacitracin-polymyxin b, Benzalkonium chloride, Bacitracin-neomycin-polymyxin, and Neosporin [neomycin-bacitracin zn-polymyx]   Review of Systems Review of Systems As noted in HPI  Physical Exam Triage Vital Signs ED Triage Vitals  Encounter Vitals Group     BP 09/11/22 1239 131/86     Systolic BP Percentile --      Diastolic BP Percentile --      Pulse Rate 09/11/22 1239 81     Resp --      Temp 09/11/22 1239 98.5 F (36.9 C)     Temp Source 09/11/22 1239 Oral     SpO2 09/11/22 1239 95 %     Weight --      Height 09/11/22 1238 5\' 11"  (1.803 m)     Head Circumference --      Peak Flow --      Pain Score 09/11/22 1237 8     Pain Loc --      Pain Education --      Exclude from Growth Chart --    No data found.  Updated Vital Signs BP 131/86 (BP Location: Left Arm)  Pulse 81   Temp 98.5 F (36.9 C) (Oral)   Ht 5\' 11"  (1.803 m)   SpO2 95%   BMI 23.01 kg/m   Visual Acuity Right Eye Distance:   Left Eye Distance:   Bilateral Distance:    Right Eye Near:   Left Eye Near:    Bilateral Near:     Physical Exam Vitals and nursing note reviewed.  Constitutional:      General: He is not in acute distress.    Appearance: He is normal weight. He is not toxic-appearing.  HENT:     Head: Normocephalic.     Right Ear: External ear normal.     Left Ear: External ear normal.  Eyes:     Extraocular Movements: Extraocular movements intact.  Pulmonary:     Effort: Pulmonary effort is normal.  Abdominal:     Palpations: There is no mass.     Tenderness: There is generalized abdominal tenderness. There is no guarding or rebound.  Musculoskeletal:        General: Normal range of motion.  Skin:    General: Skin is warm and dry.  Neurological:     Mental Status: He is alert and oriented to person, place, and time.      Gait: Gait normal.  Psychiatric:        Mood and Affect: Mood normal.        Behavior: Behavior normal.        Thought Content: Thought content normal.        Judgment: Judgment normal.      UC Treatments / Results  Labs (all labs ordered are listed, but only abnormal results are displayed) Labs Reviewed  GASTROINTESTINAL PANEL BY PCR, STOOL (REPLACES STOOL CULTURE)    EKG   Radiology No results found.  Procedures Procedures (including critical care time)  Medications Ordered in UC Medications - No data to display  Initial Impression / Assessment and Plan / UC Course  I have reviewed the triage vital signs and the nursing notes.  Travelers diarrhea  I ordered GI panel and he will be notified when the results are back.  See instructions Final Clinical Impressions(s) / UC Diagnoses   Final diagnoses:  Traveler's diarrhea     Discharge Instructions      You may take Pepto as directed in the box until we receive the results.  Avoid dairy til your stools is back to normal.      ED Prescriptions   None    PDMP not reviewed this encounter.   Garey Ham, PA-C 09/11/22 1515

## 2022-09-11 NOTE — ED Triage Notes (Signed)
Pt is with his mom  Pt c/o possible food poisioning x4days.  Pt was at [place] and was told that he had food poisoning by a doctor there.  Pt states that he is now having diarrhea every time he eats and it feels like his stomach is twisting.

## 2022-09-13 ENCOUNTER — Ambulatory Visit
Admission: EM | Admit: 2022-09-13 | Discharge: 2022-09-13 | Disposition: A | Discharge status: Home / Self Care | Payer: Self-pay | Attending: Emergency Medicine | Admitting: Emergency Medicine

## 2022-09-13 DIAGNOSIS — Z1152 Encounter for screening for COVID-19: Secondary | ICD-10-CM | POA: Insufficient documentation

## 2022-09-13 DIAGNOSIS — J069 Acute upper respiratory infection, unspecified: Secondary | ICD-10-CM | POA: Insufficient documentation

## 2022-09-13 LAB — GASTROINTESTINAL PANEL BY PCR, STOOL (REPLACES STOOL CULTURE)

## 2022-09-13 LAB — GROUP A STREP BY PCR: Group A Strep by PCR: NOT DETECTED

## 2022-09-13 LAB — SARS CORONAVIRUS 2 BY RT PCR: SARS Coronavirus 2 by RT PCR: NEGATIVE

## 2022-09-13 MED ORDER — BENZONATATE 100 MG PO CAPS: 200.0000 mg | ORAL_CAPSULE | Freq: Three times a day (TID) | ORAL | 0 refills | Status: AC

## 2022-09-13 MED ORDER — PROMETHAZINE-DM 6.25-15 MG/5ML PO SYRP: 5.0000 mL | ORAL_SOLUTION | Freq: Four times a day (QID) | ORAL | 0 refills | Status: AC | PRN

## 2022-09-13 MED ORDER — IPRATROPIUM BROMIDE 0.06 % NA SOLN: 2.0000 | Freq: Four times a day (QID) | NASAL | 12 refills | Status: AC

## 2022-09-13 NOTE — ED Triage Notes (Signed)
Here for results of stool sample. Can't see my chart. Nasal congestion and cough started last night. No fever.

## 2022-09-13 NOTE — Discharge Instructions (Addendum)
Your test today for strep and COVID were both negative.  I do believe you have a viral respiratory infection.  Use over-the-counter Tylenol and or ibuprofen according to package instructions as needed for pain.  Use the Atrovent nasal spray, 2 squirts in each nostril every 6 hours, as needed for runny nose and postnasal drip.  Use the Tessalon Perles every 8 hours during the day.  Take them with a small sip of water.  They may give you some numbness to the base of your tongue or a metallic taste in your mouth, this is normal.  Use the Promethazine DM cough syrup at bedtime for cough and congestion.  It will make you drowsy so do not take it during the day.  Return for reevaluation or see your primary care provider for any new or worsening symptoms.

## 2022-09-13 NOTE — ED Provider Notes (Signed)
[facility]    CSN: [id number] Arrival date & time: 09/13/22  1657      History   Chief Complaint Chief Complaint  Patient presents with   Cough   Nasal Congestion    HPI Nathaniel Cooley is a 27 y.o. male.   HPI  27 year old male with a past medical history significant for left wrist fracture and ADHD presents for evaluation of runny nose, nasal congestion, sneezing, and nonproductive cough that started today.  He returned from [place] 4 days ago and developed diarrhea over the weekend.  He had a stool test collected which turned up positive for ETEC.  He reports that since that time he has had a resolution of his diarrhea and has been able to eat and drink without difficulty.  No fever.  Past Medical History:  Diagnosis Date   ADHD    Wrist fracture    Left.    Patient Active Problem List   Diagnosis Date Noted   Wrist fracture 04/04/2021   Cavitary pneumonia 01/03/2020    History reviewed. No pertinent surgical history.     Home Medications    Prior to Admission medications   Medication Sig Start Date End Date Taking? Authorizing Provider  benzonatate (TESSALON) 100 MG capsule Take 2 capsules (200 mg total) by mouth every 8 (eight) hours. 09/13/22  Yes Becky Augusta, NP  ipratropium (ATROVENT) 0.06 % nasal spray Place 2 sprays into both nostrils 4 (four) times daily. 09/13/22  Yes Becky Augusta, NP  promethazine-dextromethorphan (PROMETHAZINE-DM) 6.25-15 MG/5ML syrup Take 5 mLs by mouth 4 (four) times daily as needed. 09/13/22  Yes Becky Augusta, NP    Family History Family History  Problem Relation Age of Onset   Healthy Mother    Diabetes Father     Social History Social History   Tobacco Use   Smoking status: Former   Smokeless tobacco: Never  Vaping Use   Vaping status: Never Used  Substance Use Topics   Alcohol use: Yes    Comment: Occ.   Drug use: Yes    Types: Marijuana     Allergies   Bee venom, Bacitracin-polymyxin b,  Benzalkonium chloride, Bacitracin-neomycin-polymyxin, and Neosporin [neomycin-bacitracin zn-polymyx]   Review of Systems Review of Systems  Constitutional:  Negative for fever.  HENT:  Positive for congestion, rhinorrhea and sneezing.   Respiratory:  Positive for cough. Negative for shortness of breath and wheezing.      Physical Exam Triage Vital Signs ED Triage Vitals [09/13/22 1708]  Encounter Vitals Group     BP      Systolic BP Percentile      Diastolic BP Percentile      Pulse      Resp      Temp      Temp src      SpO2      Weight 163 lb 2.3 oz (74 kg)     Height      Head Circumference      Peak Flow      Pain Score      Pain Loc      Pain Education      Exclude from Growth Chart    No data found.  Updated Vital Signs BP 132/85 (BP Location: Right Arm)   Pulse 76   Temp (!) 97.5 F (36.4 C) (Oral)   Resp 17   Wt 163 lb 2.3 oz (74 kg)   SpO2 98%   BMI 22.75 kg/m  Visual Acuity Right Eye Distance:   Left Eye Distance:   Bilateral Distance:    Right Eye Near:   Left Eye Near:    Bilateral Near:     Physical Exam Vitals and nursing note reviewed.  Constitutional:      Appearance: Normal appearance. He is not ill-appearing.  HENT:     Head: Normocephalic and atraumatic.     Right Ear: Tympanic membrane, ear canal and external ear normal. There is no impacted cerumen.     Left Ear: Tympanic membrane, ear canal and external ear normal. There is no impacted cerumen.     Nose: Congestion and rhinorrhea present.     Comments: His mucosa is erythematous and edematous with scant clear discharge in both nares.    Mouth/Throat:     Mouth: Mucous membranes are moist.     Pharynx: Oropharynx is clear. No oropharyngeal exudate or posterior oropharyngeal erythema.  Cardiovascular:     Rate and Rhythm: Normal rate and regular rhythm.     Pulses: Normal pulses.     Heart sounds: Normal heart sounds. No murmur heard.    No friction rub. No gallop.   Pulmonary:     Effort: Pulmonary effort is normal.     Breath sounds: Normal breath sounds. No wheezing, rhonchi or rales.  Musculoskeletal:     Cervical back: Normal range of motion and neck supple.  Lymphadenopathy:     Cervical: No cervical adenopathy.  Skin:    General: Skin is warm and dry.     Capillary Refill: Capillary refill takes less than 2 seconds.     Findings: No rash.  Neurological:     General: No focal deficit present.     Mental Status: He is alert and oriented to person, place, and time.      UC Treatments / Results  Labs (all labs ordered are listed, but only abnormal results are displayed) Labs Reviewed  SARS CORONAVIRUS 2 BY RT PCR  GROUP A STREP BY PCR    EKG   Radiology No results found.  Procedures Procedures (including critical care time)  Medications Ordered in UC Medications - No data to display  Initial Impression / Assessment and Plan / UC Course  I have reviewed the triage vital signs and the nursing notes.  Pertinent labs & imaging results that were available during my care of the patient were reviewed by me and considered in my medical decision making (see chart for details).   Patient is a nontoxic-appearing 27 year old male presenting for evaluation of respiratory symptoms which began this morning as outlined in HPI above.  He was recently in [place] where he contracted traveler's diarrhea.  Those symptoms have since resolved and now the respiratory symptoms have developed.  He does have inflamed nasal mucosa with clear rhinorrhea on exam.  No appreciable postnasal drip.  Cardiopulmonary exam is benign.  Given his recent travel I will order a COVID PCR and strep PCR.  COVID PCR is negative.  Strep PCR is negative.  I will discharge patient on the diagnosis of viral URI with a cough.  I will prescribe Atrovent nasal spray, Tessalon Perles, Promethazine DM cough syrup.  Work note provided.     Final Clinical Impressions(s)  / UC Diagnoses   Final diagnoses:  Viral URI with cough     Discharge Instructions      Your test today for strep and COVID were both negative.  I do believe you have a viral respiratory  infection.  Use over-the-counter Tylenol and or ibuprofen according to package instructions as needed for pain.  Use the Atrovent nasal spray, 2 squirts in each nostril every 6 hours, as needed for runny nose and postnasal drip.  Use the Tessalon Perles every 8 hours during the day.  Take them with a small sip of water.  They may give you some numbness to the base of your tongue or a metallic taste in your mouth, this is normal.  Use the Promethazine DM cough syrup at bedtime for cough and congestion.  It will make you drowsy so do not take it during the day.  Return for reevaluation or see your primary care provider for any new or worsening symptoms.      ED Prescriptions     Medication Sig Dispense Auth. Provider   benzonatate (TESSALON) 100 MG capsule Take 2 capsules (200 mg total) by mouth every 8 (eight) hours. 21 capsule Becky Augusta, NP   ipratropium (ATROVENT) 0.06 % nasal spray Place 2 sprays into both nostrils 4 (four) times daily. 15 mL Becky Augusta, NP   promethazine-dextromethorphan (PROMETHAZINE-DM) 6.25-15 MG/5ML syrup Take 5 mLs by mouth 4 (four) times daily as needed. 118 mL Becky Augusta, NP      PDMP not reviewed this encounter.   Becky Augusta, NP 09/13/22 1815

## 2023-09-08 NOTE — Telephone Encounter (Signed)
 error
